# Patient Record
Sex: Male | Born: 1960
Health system: Southern US, Community
[De-identification: ages and names within clinical notes are randomized; demographics above are authoritative.]

## PROBLEM LIST (undated history)

## (undated) DIAGNOSIS — K589 Irritable bowel syndrome without diarrhea: Secondary | ICD-10-CM

## (undated) DIAGNOSIS — I10 Essential (primary) hypertension: Secondary | ICD-10-CM

## (undated) DIAGNOSIS — S43006A Unspecified dislocation of unspecified shoulder joint, initial encounter: Secondary | ICD-10-CM

## (undated) HISTORY — DX: Essential (primary) hypertension: I10

## (undated) HISTORY — DX: Irritable bowel syndrome without diarrhea: K58.9

## (undated) HISTORY — DX: Unspecified dislocation of unspecified shoulder joint, initial encounter: S43.006A

---

## 2001-04-04 ENCOUNTER — Encounter: Admission: RE | Admit: 2001-04-04 | Discharge: 2001-05-29 | Payer: Self-pay | Admitting: *Deleted

## 2004-11-30 HISTORY — PX: CHOLECYSTECTOMY: SHX55

## 2005-01-20 ENCOUNTER — Encounter: Admission: RE | Admit: 2005-01-20 | Discharge: 2005-01-20 | Payer: Self-pay | Admitting: Family Medicine

## 2005-01-26 ENCOUNTER — Observation Stay (HOSPITAL_COMMUNITY): Admission: RE | Admit: 2005-01-26 | Discharge: 2005-01-27 | Payer: Self-pay | Admitting: General Surgery

## 2005-09-18 IMAGING — US US ABDOMEN COMPLETE
1 series · 14 of 25 positions shown · non-contrast
Comparison: none

CLINICAL DATA: Elevated LFTs.  Epigastric pain with nausea and post prandial vomiting.
ULTRASOUND ABDOMEN, COMPLETE ? 01/20/05:
Some shadowing floating tiny gallstones and sludge are seen.   The gallbladder wall is thickened measuring 6 mm with no definite ultrasound Murphy sign with patient tender in epigastric region.  No dilated intrahepatic nor extrahepatic bile ducts are seen with the common bile duct measuring normally at 4 mm.  Liver, spleen, bilateral kidneys, and mid to distal abdominal aorta are sonographically normal.  Limited assessment if inferior vena cava, pancreas, and proximal abdominal aorta due to overlying gastrointestinal gas are noted.

[Series 1: unknown · 0.27mm/px · 14 of 58 slices shown]
[im 1/58]
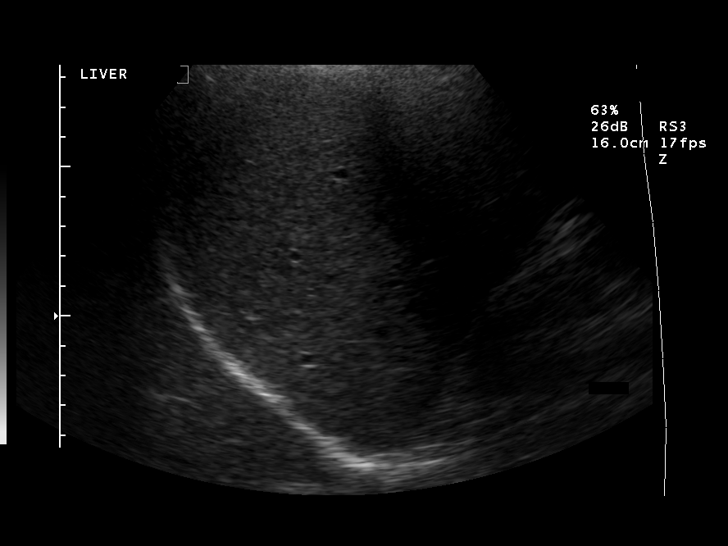
[im 5/58]
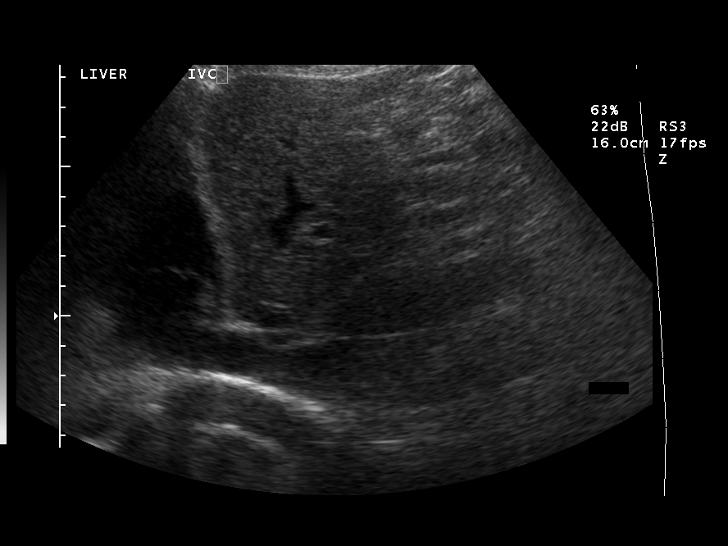
[im 10/58]
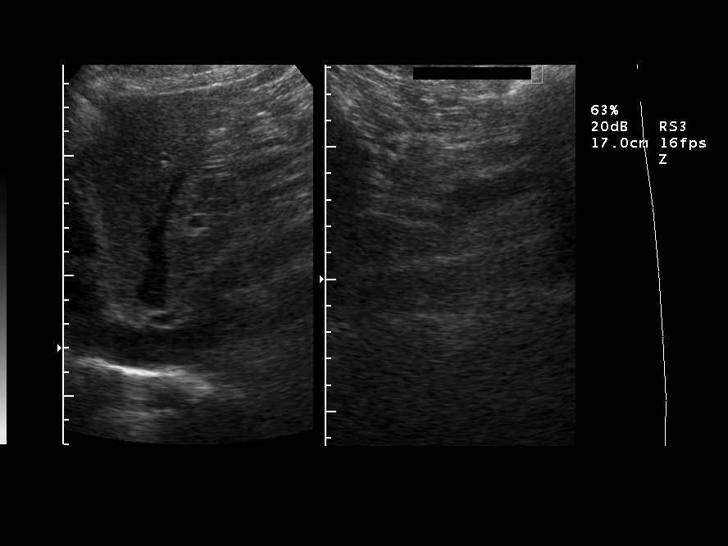
[im 15/58]
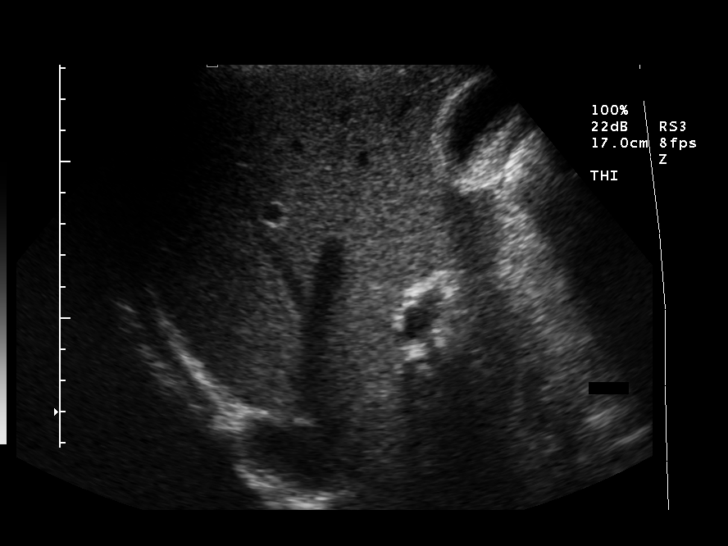
[im 20/58]
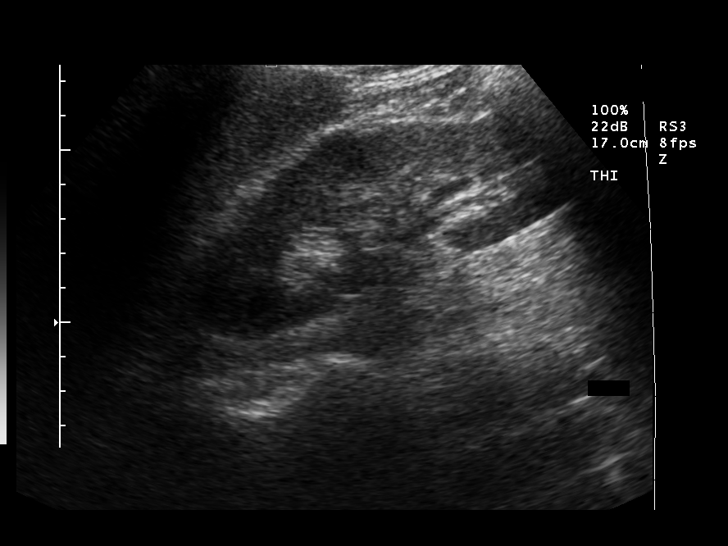
[im 22/58]
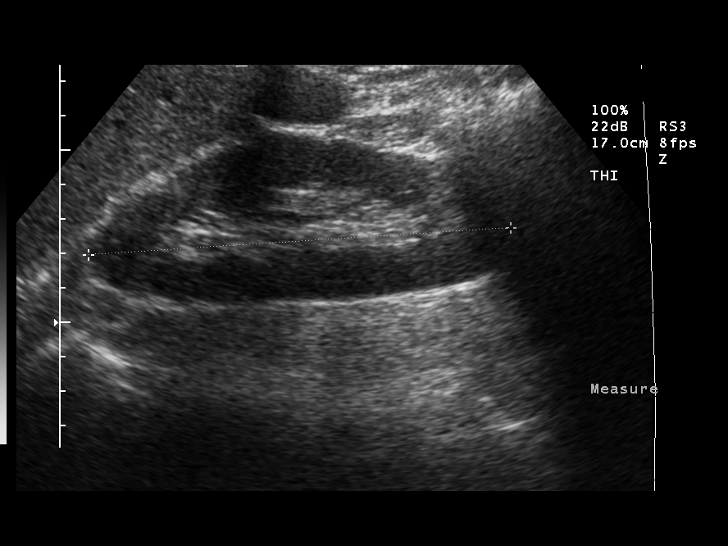
[im 27/58]
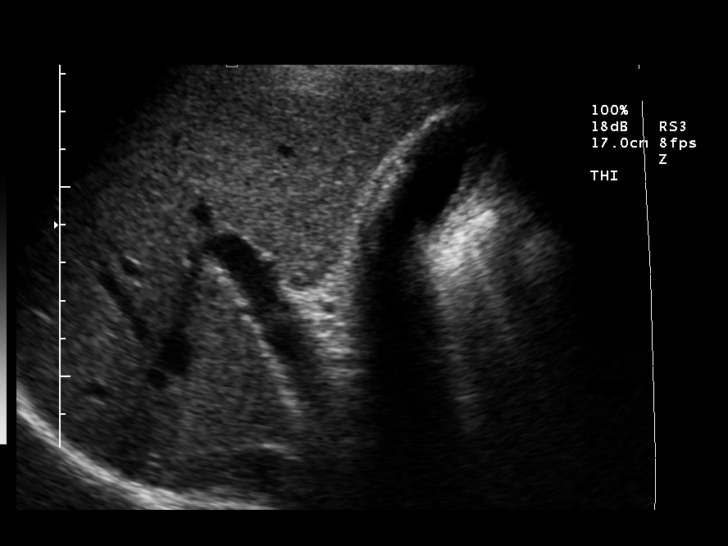
[im 31/58]
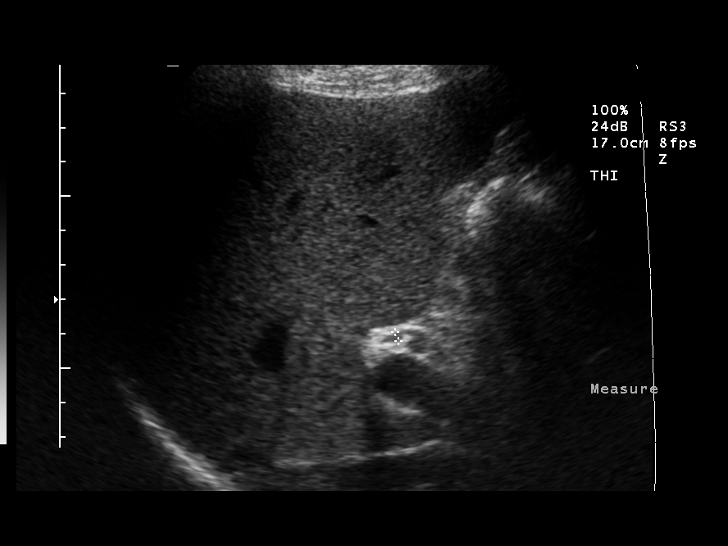
[im 36/58]
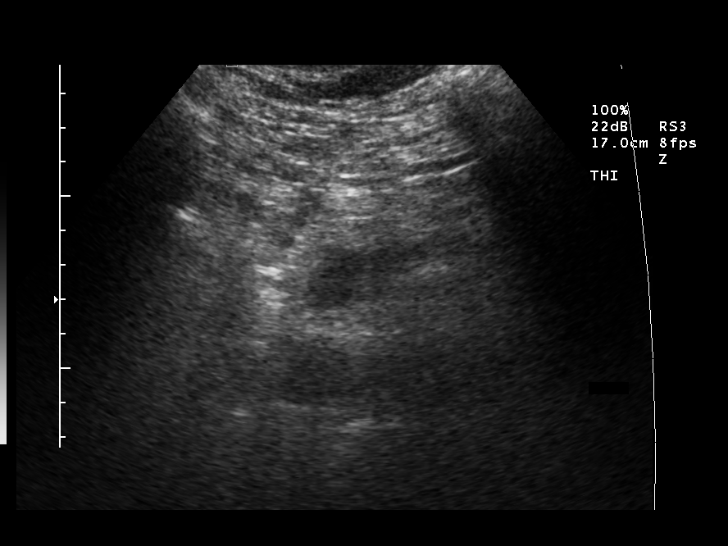
[im 39/58]
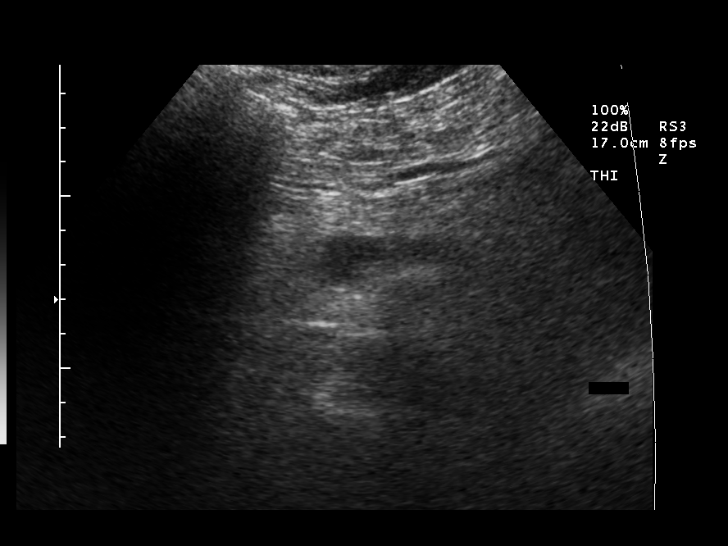
[im 43/58]
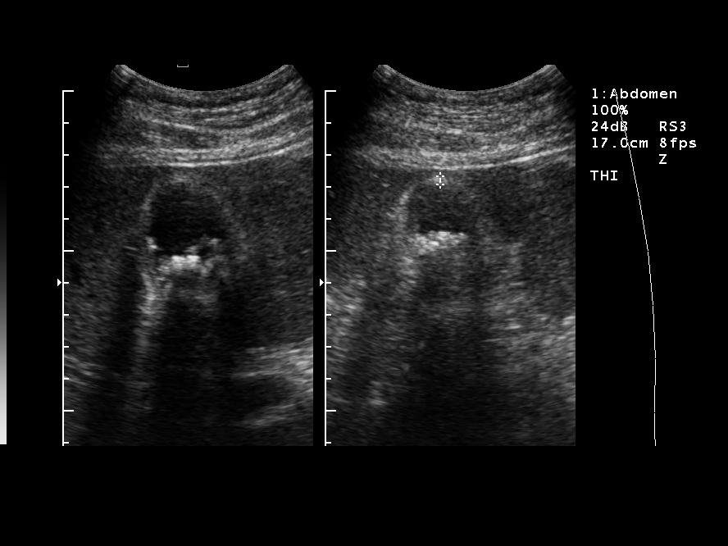
[im 48/58]
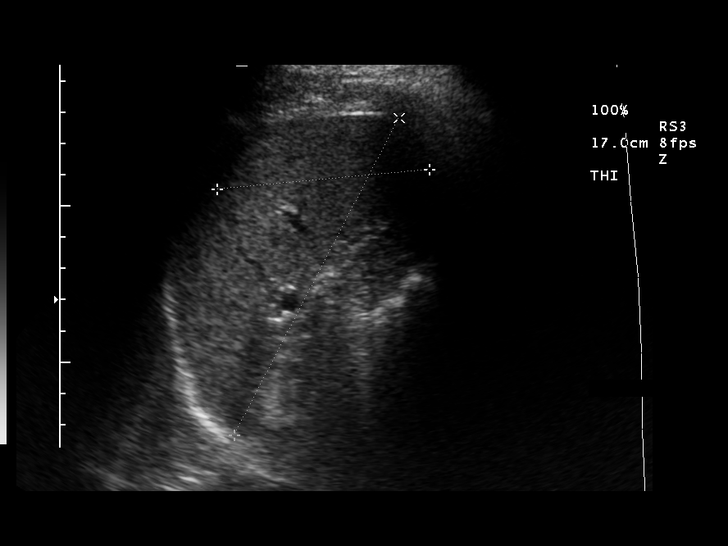
[im 53/58]
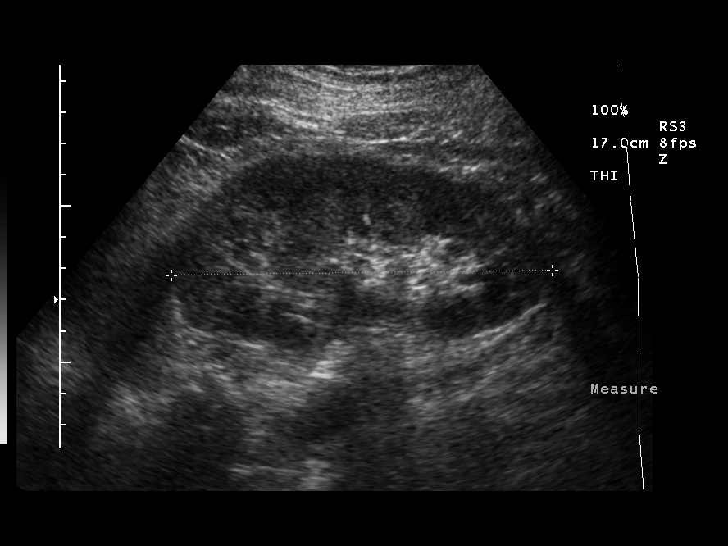
[im 58/58]
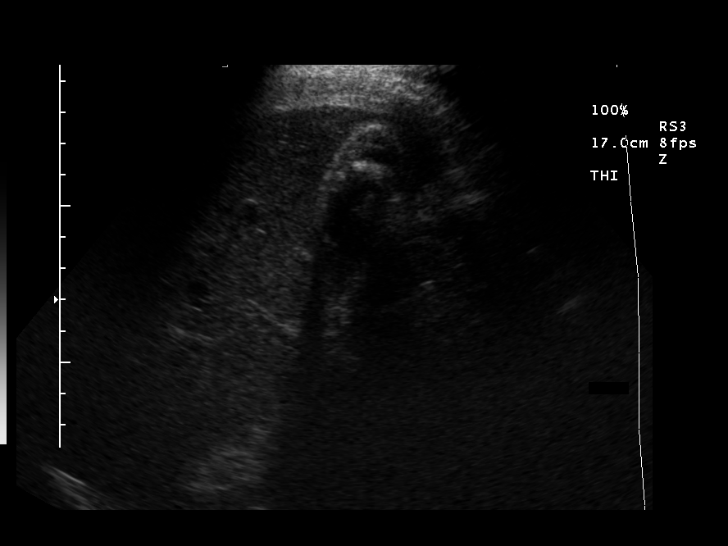

[14 of 25 positions shown; findings below may reference images not displayed]

IMPRESSION: 1.  Gallbladder sludge and tiny shadowing gallstones with abnormal gallbladder wall thickening of chronic or acute cholecystitis. 
2.  Slight limitations of inferior vena cava, pancreas, and proximal abdominal aorta.
3.  Otherwise normal.

## 2005-09-24 IMAGING — RF DG CHOLANGIOGRAM OPERATIVE
1 series · 24 of 24 positions shown · non-contrast
Comparison: none

CLINICAL DATA: Cholelithiasis

[Series 1: run · 24 of 41 slices shown]
[im 1/41]
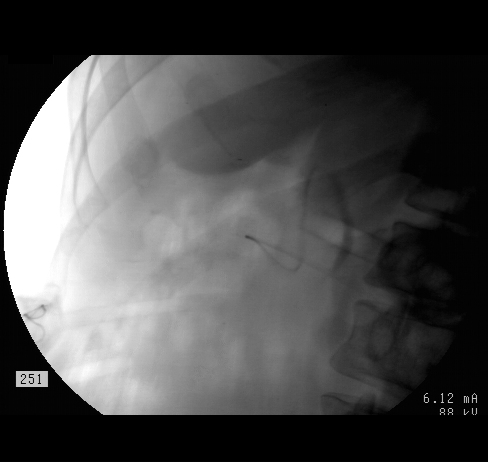
[im 2/41]
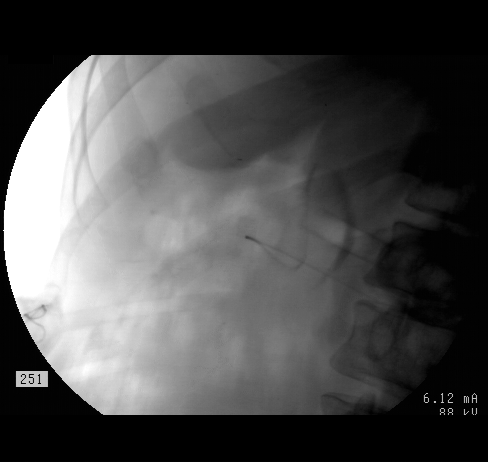
[im 4/41]
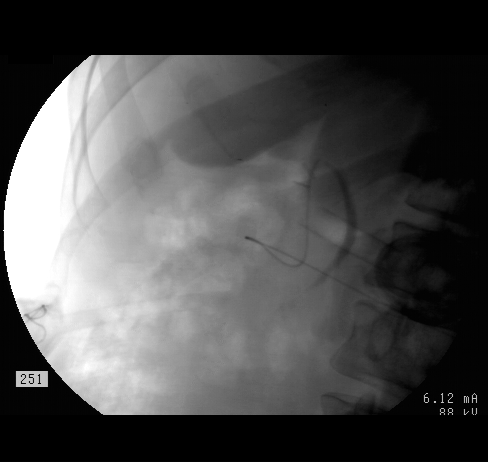
[im 6/41]
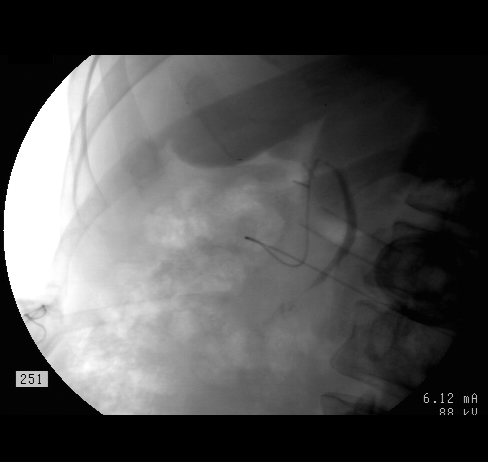
[im 7/41]
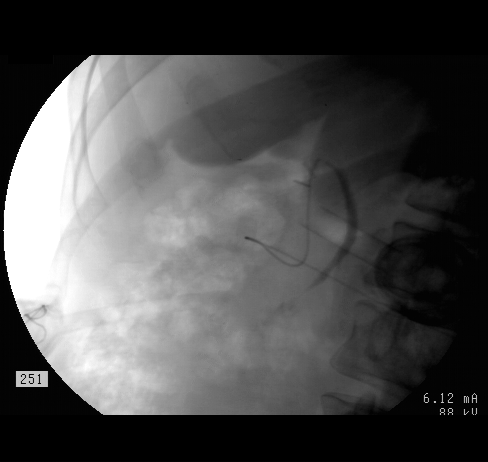
[im 9/41]
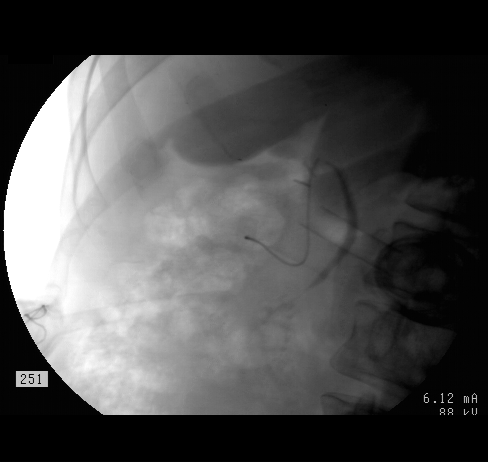
[im 11/41]
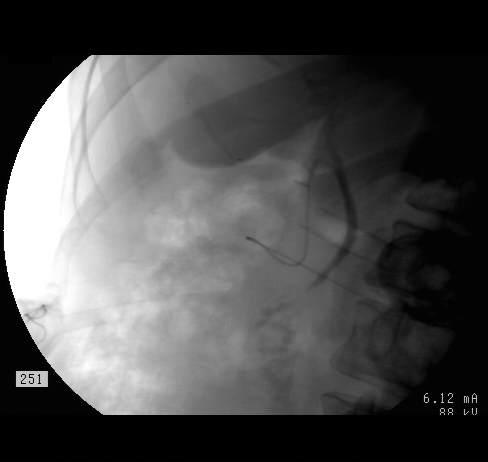
[im 13/41]
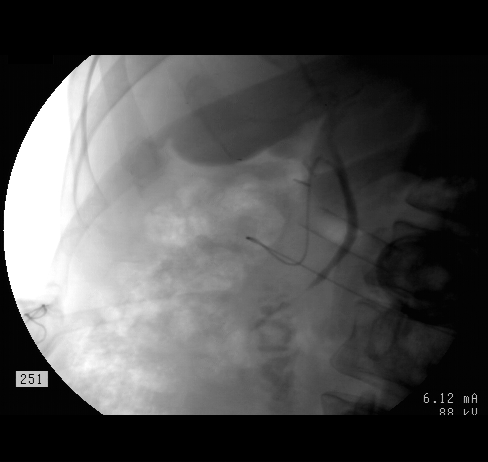
[im 14/41]
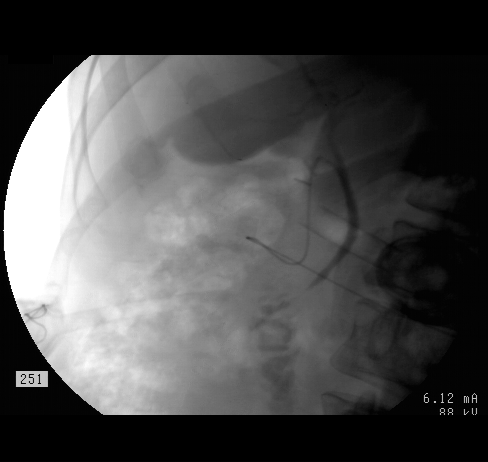
[im 16/41]
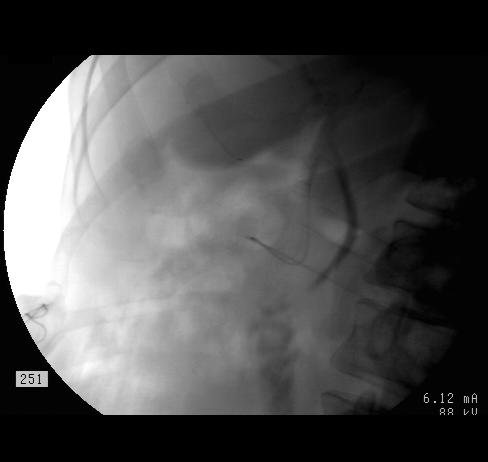
[im 18/41]
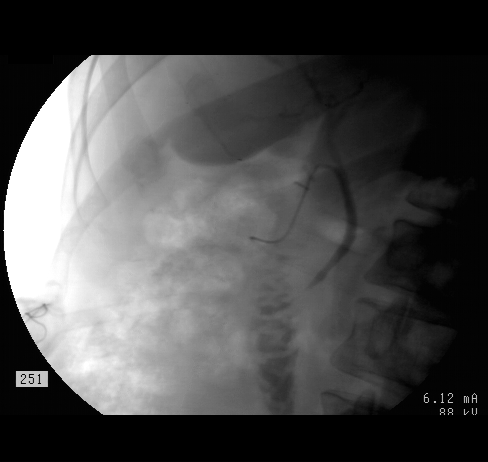
[im 20/41]
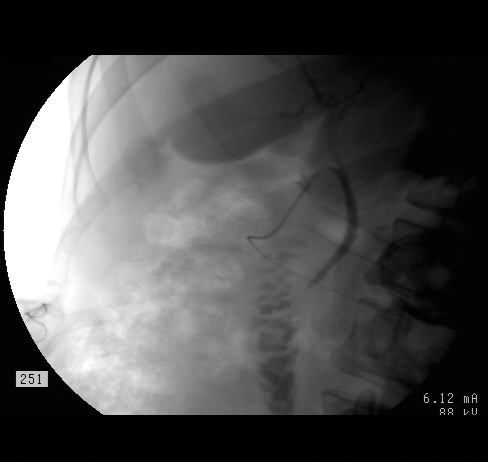
[im 21/41]
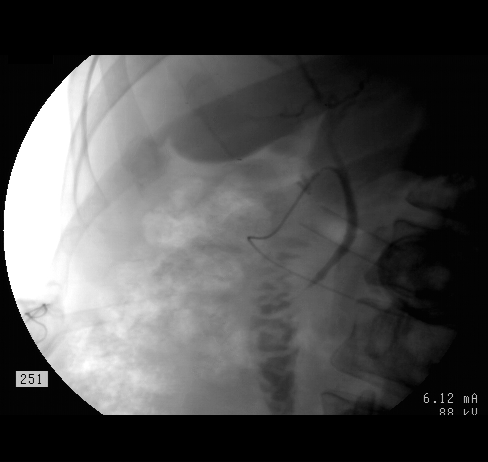
[im 23/41]
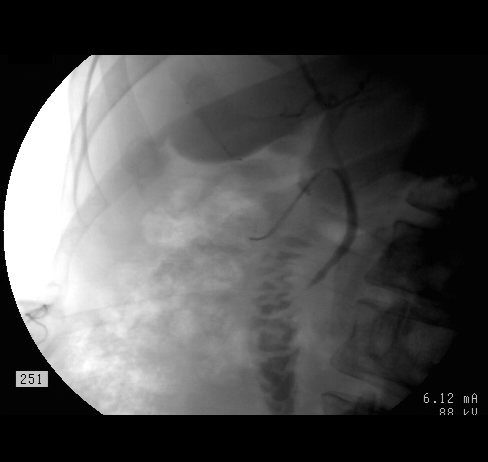
[im 25/41]
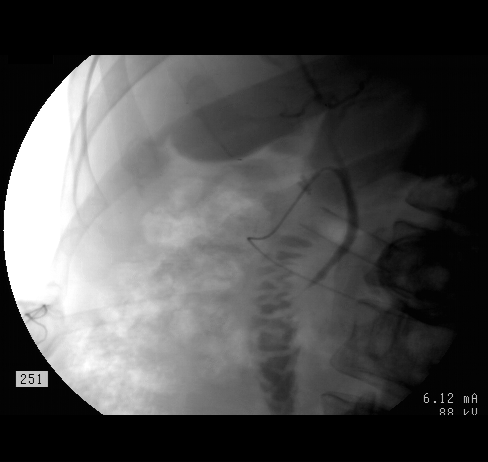
[im 27/41]
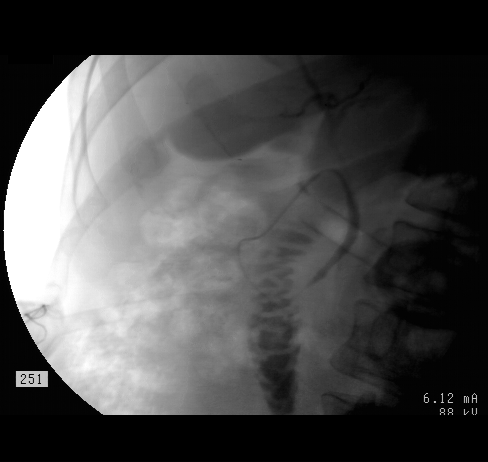
[im 28/41]
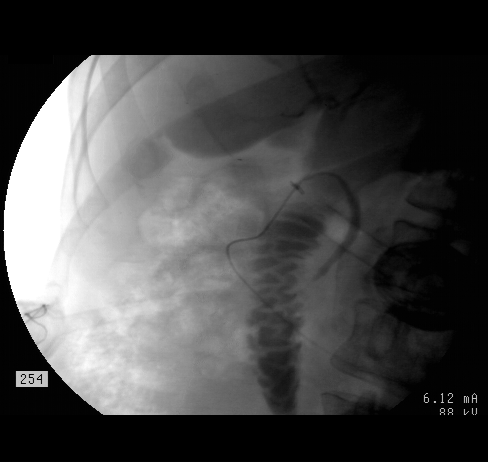
[im 30/41]
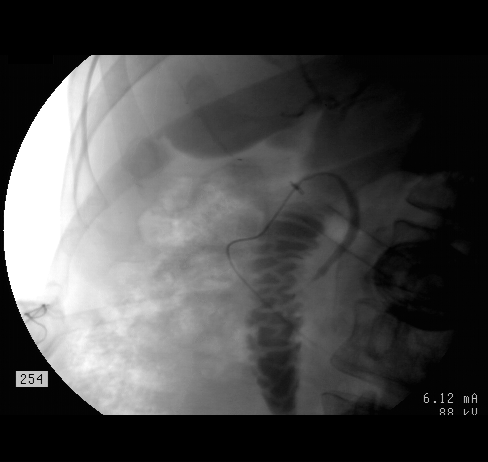
[im 32/41]
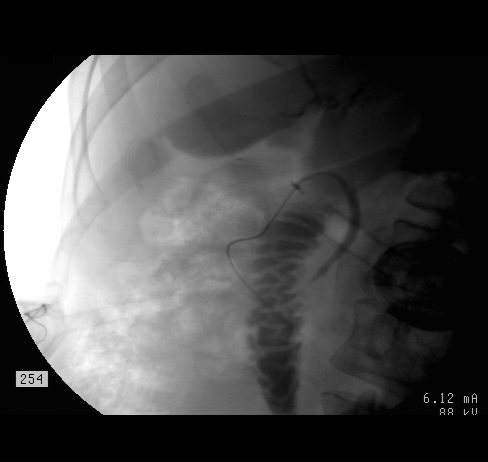
[im 34/41]
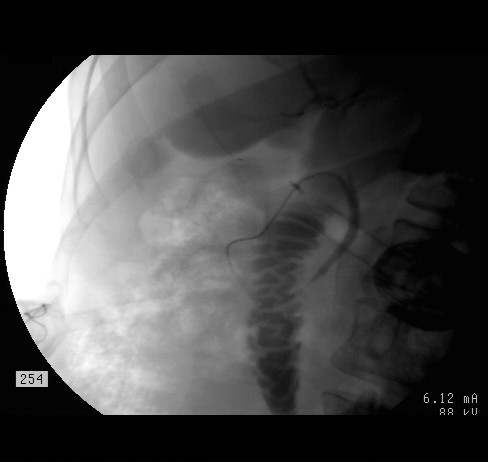
[im 35/41]
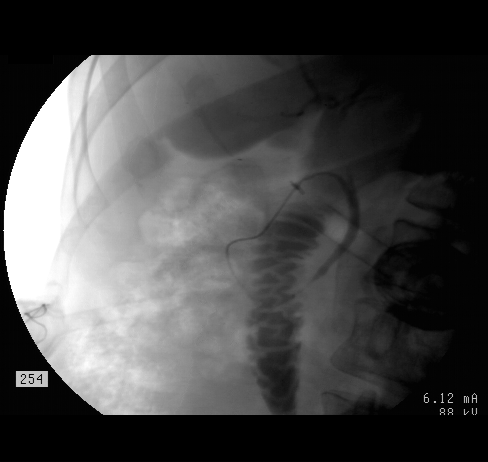
[im 37/41]
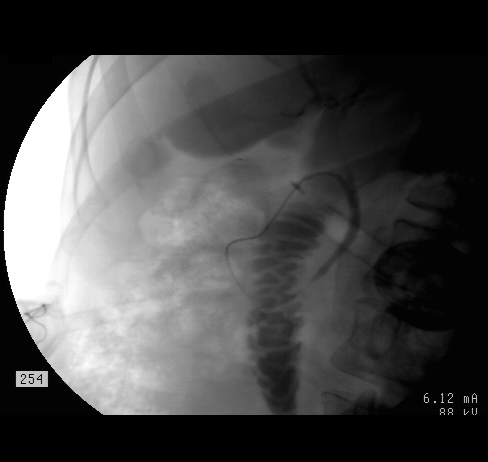
[im 39/41]
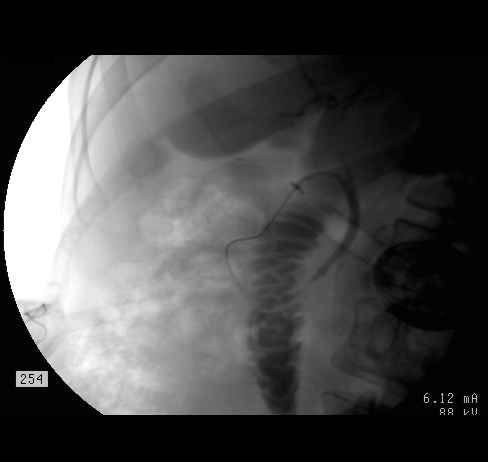
[im 41/41]
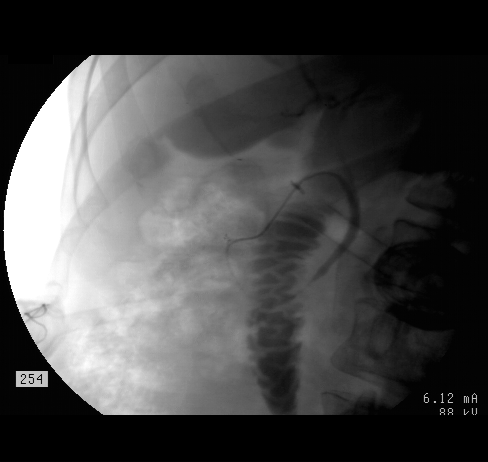

[24 of 24 positions shown; findings below may reference images not displayed]

INTRAOPERATIVE CHOLANGIOGRAM:

41 images  from intraoperative C-arm fluoroscopy demonstrates opacification of
the common bile duct. No filling defects to suggest retained stones. There is
incomplete evaluation of intrahepatic biliary tree, which appears decompressed
centrally. Contrast appears to flow on into decompressed duodenum.
IMPRESSION: 1. Negative for retained common duct stone

## 2011-05-24 ENCOUNTER — Encounter: Payer: Self-pay | Admitting: Family Medicine

## 2011-05-24 DIAGNOSIS — K589 Irritable bowel syndrome without diarrhea: Secondary | ICD-10-CM | POA: Insufficient documentation

## 2013-12-28 DIAGNOSIS — S43006A Unspecified dislocation of unspecified shoulder joint, initial encounter: Secondary | ICD-10-CM

## 2013-12-28 HISTORY — DX: Unspecified dislocation of unspecified shoulder joint, initial encounter: S43.006A

## 2014-05-08 ENCOUNTER — Encounter: Payer: Self-pay | Admitting: *Deleted

## 2014-06-08 ENCOUNTER — Ambulatory Visit (INDEPENDENT_AMBULATORY_CARE_PROVIDER_SITE_OTHER): Payer: 59 | Admitting: Family Medicine

## 2014-06-08 ENCOUNTER — Encounter: Payer: Self-pay | Admitting: Family Medicine

## 2014-06-08 VITALS — BP 138/100 | HR 84 | Temp 97.8°F | Resp 14 | Ht 70.0 in | Wt 204.0 lb

## 2014-06-08 DIAGNOSIS — Z0000 Encounter for general adult medical examination without abnormal findings: Secondary | ICD-10-CM

## 2014-06-08 DIAGNOSIS — Z23 Encounter for immunization: Secondary | ICD-10-CM

## 2014-06-08 DIAGNOSIS — I10 Essential (primary) hypertension: Secondary | ICD-10-CM | POA: Insufficient documentation

## 2014-06-08 DIAGNOSIS — Z125 Encounter for screening for malignant neoplasm of prostate: Secondary | ICD-10-CM

## 2014-06-08 DIAGNOSIS — H6120 Impacted cerumen, unspecified ear: Secondary | ICD-10-CM

## 2014-06-08 DIAGNOSIS — Z1211 Encounter for screening for malignant neoplasm of colon: Secondary | ICD-10-CM

## 2014-06-08 DIAGNOSIS — H6121 Impacted cerumen, right ear: Secondary | ICD-10-CM

## 2014-06-08 DIAGNOSIS — K589 Irritable bowel syndrome without diarrhea: Secondary | ICD-10-CM

## 2014-06-08 DIAGNOSIS — Z8249 Family history of ischemic heart disease and other diseases of the circulatory system: Secondary | ICD-10-CM

## 2014-06-08 DIAGNOSIS — R030 Elevated blood-pressure reading, without diagnosis of hypertension: Secondary | ICD-10-CM

## 2014-06-08 NOTE — Assessment & Plan Note (Addendum)
Consider Bentyl, will f/u labs first Colonoscopy needed

## 2014-06-08 NOTE — Addendum Note (Signed)
Addended by: Sheral Flow on: 06/08/2014 04:16 PM   Modules accepted: Orders

## 2014-06-08 NOTE — Assessment & Plan Note (Addendum)
Elevated BP in office as well, he states he has some anxiety at the doctors office, Will have him check BP at home once a day ,  F/U3 weeks if still elevated > 140/90  Start norvasc 5mg 

## 2014-06-08 NOTE — Progress Notes (Signed)
[name], male   DOB: 1961-01-23, 53 y.o.   MRN: [id number]   Subjective:    [name], male    DOB: 10/18/1961, 53 y.o.   MRN: [id number]  Patient presents for New Patient CPE  patient here to establish care. He's never had a primary care provider. He's not had a physical in greater than 20 years. 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 She has a history of self diagnosed irritable bowel syndrome he's had this since he was a young child. 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 He's been taking Imodium on and off as well as other over-the-counter medications to help. He's never had a colonoscopy.  He was recently at his DOT physical and his blood pressure was elevated he has checked a couple times at home and it has been fluctuating from 140-150 over 80s to 100. He does have a family history of heart disease his father had a heart attack at age 57. In the past he did have a screening done on his cholesterol was also show severely elevated cholesterol but he is not on medications because his father had a severe reaction to Lipitor. He has had a mild headache over the past couple of months he thought this was due to being alone dehydrated as he does not eat or drink very much while he is on a truck. He also had an episode of severe indigestion which has resolved. H/o stress test in 90's which was normal  He is due for tetanus booster due for colonoscopy due for prostate cancer screening Review Of Systems:  GEN- denies fatigue, fever, weight loss,weakness, recent illness HEENT- denies eye drainage, change in vision, nasal discharge, CVS- denies chest pain, palpitations RESP- denies SOB, cough, wheeze ABD- denies N/V, change in stools, abd pain GU-  denies dysuria, hematuria, dribbling, incontinence MSK- denies joint pain, muscle aches, injury Neuro- +headache, dizziness, syncope, seizure activity       Objective:    BP 140/88  Pulse 84  Temp(Src) 97.8 F (36.6 C) (Oral)  Resp 14  Ht 5\' 10"  (1.778 m)  Wt 204 lb (92.534 kg)  BMI 29.27 kg/m2 GEN- NAD, alert and oriented x3 HEENT- PERRL, EOMI, non injected sclera, pink conjunctiva, MMM, oropharynx clear, Right TM impacted wax, left TM clear Neck- Supple, no thyromegaly CVS- RRR, no murmur RESP-CTAB ABD-NABS,soft,NT,ND GU- Rectum- normal tone, external skin tag, FOBT neg, prostate smooth no nodules Psych- normal affect and mood EXT- No edema Pulses- Radial, DP- 2+  EKG- NSR, no ST changes       Assessment & Plan:      Problem List Items Addressed This Visit   Routine general medical examination at a health care facility - Primary     CPE done Tetanus Booster given Fasting labs Referral for colonoscopy    Relevant Orders      CBC with Differential      Comprehensive metabolic panel      Lipid panel      PSA   IBS (irritable bowel syndrome)     Trial of Bentyl with meals Colonoscopy needed    Elevated blood pressure (not hypertension)     Elevated BP in office as well, he states  he has some anxiety at the doctors office, Will have him check BP at home once a day ,  F/U 2 weeks if still elevated > 140/90  Start norvasc 5mg      Relevant Orders      TSH    Other Visit Diagnoses   Prostate cancer screening        Relevant Orders       PSA       Note: This dictation was prepared with Dragon dictation along with smaller phrase technology. Any transcriptional errors that result from this process are unintentional.

## 2014-06-08 NOTE — Assessment & Plan Note (Signed)
S/p irrigation

## 2014-06-08 NOTE — Assessment & Plan Note (Signed)
CPE done Tetanus Booster given Fasting labs Referral for colonoscopy

## 2014-06-08 NOTE — Patient Instructions (Addendum)
I recommend eye visit once a year I recommend dental visit every 6 months Goal is to  Exercise 30 minutes 5 days a week We will send a letter with lab results  Referral for colonoscopy  F/U 4 weeks  Bring in your blood results

## 2014-06-09 LAB — COMPREHENSIVE METABOLIC PANEL
ALBUMIN: 4.8 g/dL (ref 3.5–5.2)
ALT: 24 U/L (ref 0–53)
AST: 21 U/L (ref 0–37)
Alkaline Phosphatase: 72 U/L (ref 39–117)
BUN: 10 mg/dL (ref 6–23)
CO2: 28 mEq/L (ref 19–32)
Calcium: 9.8 mg/dL (ref 8.4–10.5)
Chloride: 104 mEq/L (ref 96–112)
Creat: 1.09 mg/dL (ref 0.50–1.35)
Glucose, Bld: 95 mg/dL (ref 70–99)
POTASSIUM: 4.6 meq/L (ref 3.5–5.3)
SODIUM: 142 meq/L (ref 135–145)
Total Bilirubin: 1.7 mg/dL — ABNORMAL HIGH (ref 0.2–1.2)
Total Protein: 7.2 g/dL (ref 6.0–8.3)

## 2014-06-09 LAB — TSH: TSH: 1.84 u[IU]/mL (ref 0.350–4.500)

## 2014-06-09 LAB — CBC WITH DIFFERENTIAL/PLATELET
BASOS ABS: 0.1 10*3/uL (ref 0.0–0.1)
BASOS PCT: 1 % (ref 0–1)
EOS ABS: 0.1 10*3/uL (ref 0.0–0.7)
EOS PCT: 2 % (ref 0–5)
HEMATOCRIT: 45 % (ref 39.0–52.0)
HEMOGLOBIN: 15.4 g/dL (ref 13.0–17.0)
LYMPHS PCT: 21 % (ref 12–46)
Lymphs Abs: 1.2 10*3/uL (ref 0.7–4.0)
MCH: 29.6 pg (ref 26.0–34.0)
MCHC: 34.2 g/dL (ref 30.0–36.0)
MCV: 86.4 fL (ref 78.0–100.0)
MONO ABS: 0.5 10*3/uL (ref 0.1–1.0)
MONOS PCT: 8 % (ref 3–12)
NEUTROS ABS: 4 10*3/uL (ref 1.7–7.7)
Neutrophils Relative %: 68 % (ref 43–77)
Platelets: 218 10*3/uL (ref 150–400)
RBC: 5.21 MIL/uL (ref 4.22–5.81)
RDW: 14.5 % (ref 11.5–15.5)
WBC: 5.9 10*3/uL (ref 4.0–10.5)

## 2014-06-09 LAB — LIPID PANEL
Cholesterol: 235 mg/dL — ABNORMAL HIGH (ref 0–200)
HDL: 38 mg/dL — ABNORMAL LOW (ref >39)
LDL CALC: 155 mg/dL — AB (ref 0–99)
Total CHOL/HDL Ratio: 6.2 Ratio
Triglycerides: 209 mg/dL — ABNORMAL HIGH (ref <150)
VLDL: 42 mg/dL — ABNORMAL HIGH (ref 0–40)

## 2014-06-09 LAB — PSA: PSA: 1.69 ng/mL (ref <=4.00)

## 2014-06-23 ENCOUNTER — Encounter: Payer: Self-pay | Admitting: Family Medicine

## 2014-07-20 ENCOUNTER — Ambulatory Visit (INDEPENDENT_AMBULATORY_CARE_PROVIDER_SITE_OTHER): Payer: 59 | Admitting: Family Medicine

## 2014-07-20 ENCOUNTER — Encounter: Payer: Self-pay | Admitting: Family Medicine

## 2014-07-20 VITALS — BP 148/92 | HR 68 | Temp 97.5°F | Resp 12 | Ht 70.0 in | Wt 201.0 lb

## 2014-07-20 DIAGNOSIS — R202 Paresthesia of skin: Secondary | ICD-10-CM

## 2014-07-20 DIAGNOSIS — R209 Unspecified disturbances of skin sensation: Secondary | ICD-10-CM

## 2014-07-20 DIAGNOSIS — I10 Essential (primary) hypertension: Secondary | ICD-10-CM

## 2014-07-20 DIAGNOSIS — M79609 Pain in unspecified limb: Secondary | ICD-10-CM

## 2014-07-20 DIAGNOSIS — M25519 Pain in unspecified shoulder: Secondary | ICD-10-CM

## 2014-07-20 DIAGNOSIS — M25512 Pain in left shoulder: Secondary | ICD-10-CM

## 2014-07-20 MED ORDER — AMLODIPINE BESYLATE 5 MG PO TABS: 5.0000 mg | ORAL_TABLET | Freq: Every day | ORAL | Status: DC

## 2014-07-20 NOTE — Patient Instructions (Addendum)
Norvasc 5mg  once a day  Call me in 2 weeks with blood pressure readings.  To Whom it May Concern:     Mr. Garno sustained a left shoulder injury on the job in March 2015. For the past 4 weeks he has increased pain in rotator cuff and paresthesia of the 3rd-5th digits. I would recommend MRI of shoulder.  Please contact me if you have any concerns.   [phone].    -- Dr. Vic Blackbird

## 2014-07-20 NOTE — Progress Notes (Signed)
[name], male   DOB: 1961/05/31, 53 y.o.   MRN: [id number]   Subjective:    [name], male    DOB: 1961/05/18, 53 y.o.   MRN: [id number]  Patient presents for 4 week F/U and Shoulder Dislocation  patient here to followup elevated blood pressure. He's taken his blood pressure at home is ranging from 137-149/85-104 he did have a headache when his blood pressure was elevated a couple weeks ago but none since then no chest pain no shortness of breath. He also complains of recurrent left shoulder pain with tingling and numbness into his third through fifth digits. He sustained an injury to his shoulder when he was stepping over some tires back in March of 2015 he reached up to try staying himself on the job which resulted in dislocation of his left shoulder and some paresthesias at that time. It did improve that he was seen by workers, now the symptoms have returned over the past 4 weeks.    Review Of Systems:  GEN- denies fatigue, fever, weight loss,weakness, recent illness HEENT- denies eye drainage, change in vision, nasal discharge, CVS- denies chest pain, palpitations RESP- denies SOB, cough, wheeze ABD- denies N/V, change in stools, abd pain GU- denies dysuria, hematuria, dribbling, incontinence MSK- + joint pain, muscle aches, injury Neuro- denies headache, dizziness, syncope, seizure activity       Objective:    BP 148/92  Pulse 68  Temp(Src) 97.5 F (36.4 C) (Oral)  Resp 12  Ht 5\' 10"  (1.778 m)  Wt 201 lb (91.173 kg)  BMI 28.84 kg/m2 GEN- NAD, alert and oriented x3 Neck- Supple, good ROM MSK- TTP lateral shoulder and post shoulder, + empty can, pain with rotation  Neuro- normal tone, sensation in tact, normal monofilament, neg tinels, strength equal bilat UE, Pulses- Radial 2+        Assessment & Plan:      Problem List Items Addressed This Visit   Paresthesia and pain of left extremity     possible injury with brachial plexus  when his shoulder was dislocated or some other nerve impingment    Pain in joint, shoulder region     Based on injury and recurrent symptoms would benefit from MRI of shoulder    Essential hypertension, benign - Primary   Relevant Medications      amLODIpine (NORVASC) tablet      Note: This dictation was prepared with Dragon dictation along with smaller phrase technology. Any transcriptional errors that result from this process are unintentional.

## 2014-07-21 NOTE — Assessment & Plan Note (Addendum)
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

## 2014-07-21 NOTE — Assessment & Plan Note (Signed)
possible injury with brachial plexus when his shoulder was dislocated or some other nerve impingment

## 2014-08-11 ENCOUNTER — Encounter: Payer: Self-pay | Admitting: Family Medicine

## 2014-08-14 ENCOUNTER — Other Ambulatory Visit: Payer: Self-pay

## 2014-12-25 ENCOUNTER — Other Ambulatory Visit: Payer: Self-pay | Admitting: Family Medicine

## 2014-12-25 NOTE — Telephone Encounter (Signed)
Medication filled x1 with no refills.   Requires office visit before any further refills can be given.   Letter sent.  

## 2015-01-18 ENCOUNTER — Encounter: Payer: Self-pay | Admitting: Family Medicine

## 2015-01-18 ENCOUNTER — Ambulatory Visit (INDEPENDENT_AMBULATORY_CARE_PROVIDER_SITE_OTHER): Payer: 59 | Admitting: Family Medicine

## 2015-01-18 VITALS — BP 150/86 | HR 71 | Temp 98.0°F | Resp 16 | Ht 70.0 in | Wt 198.0 lb

## 2015-01-18 DIAGNOSIS — R0789 Other chest pain: Secondary | ICD-10-CM | POA: Diagnosis not present

## 2015-01-18 DIAGNOSIS — I10 Essential (primary) hypertension: Secondary | ICD-10-CM | POA: Diagnosis not present

## 2015-01-18 DIAGNOSIS — E785 Hyperlipidemia, unspecified: Secondary | ICD-10-CM

## 2015-01-18 NOTE — Assessment & Plan Note (Addendum)
Blood pressure uncontrolled D/C NORVASC Given samples of benicar 40mg , he is take 1/2 tablet once a day , f/u 2 weeks for fasting labs and BP recheck

## 2015-01-18 NOTE — Assessment & Plan Note (Addendum)
He did not tolerate Crestor, has family history of hyperlipidemia, many unable to tolerate cholesterol medication. Has given him samples of  Zetia to try. He is also working on changing his diet. He will return to have fasting labs done

## 2015-01-18 NOTE — Patient Instructions (Addendum)
Return for fasting labs Try the zetia for cholesterol Stop the norvasc Try Benicar 20mg  once a day  F/U 2 weeks for blood pressure recheck

## 2015-01-18 NOTE — Progress Notes (Signed)
[name], male   DOB: 10/18/1961, 54 y.o.   MRN: [id number]   Subjective:    [name], male    DOB: 1961-02-03, 54 y.o.   MRN: [id number]  Patient presents for F/U HTN and Medication Review/ Refills  Patient year to follow blood pressure. He states since he started the Norvasc which is back in September 2015 he has had twinges of chest pain on the left side it is nonradiating no shortness of breath or diaphoresis associated. He then switched up to taking his medicine at night and he was still gets symptoms. He has not had any leg swelling. He states his blood pressure is still been running high as well as typically been in the 150s over 90s. 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 His sleep has been good until recently when the time changed  Hyperlipidemia- he tried crestor caused cramps in hands  Review Of Systems:  GEN- denies fatigue, fever, weight loss,weakness, recent illness HEENT- denies eye drainage, change in vision, nasal discharge, CVS- +chest pain, Denies palpitations RESP- denies SOB, cough, wheeze ABD- denies N/V, change in stools, abd pain GU- denies dysuria, hematuria, dribbling, incontinence MSK- denies joint pain, muscle aches, injury Neuro- denies headache, dizziness, syncope, seizure activity       Objective:    BP 150/86 mmHg  Pulse 71  Temp(Src) 98 F (36.7 C) (Oral)  Resp 16  Ht 5\' 10"  (1.778 m)  Wt 198 lb (89.812 kg)  BMI 28.41 kg/m2 GEN- NAD, alert and oriented x3 CVS- RRR, no murmur RESP-CTAB Psych- Normal affect and mood EXT- No edema Pulses- Radial,2+  EKG- NSR, HR 56, no ST changes       Assessment & Plan:      Problem List  Items Addressed This Visit      Unprioritized   Hyperlipidemia    He did not tolerate Crestor, has family history of hyperlipidemia, many unable to tolerate cholesterol medication. Has given him samples of setting and to try. He is also working on changing his diet. He will return to have fasting labs done      Essential hypertension, benign    Blood pressure uncontrolled       Other Visit Diagnoses    Atypical chest pain    -  Primary    Chest pain atypical, not typical SE of norvasc either, but will d/c norvasc as this is when symptoms started, does not appear to be overly anxious    Relevant Orders    EKG 12-Lead (Completed)       Note: This dictation was prepared with Dragon dictation along with smaller phrase technology. Any transcriptional errors that result from this process are unintentional.

## 2015-02-01 ENCOUNTER — Encounter: Payer: Self-pay | Admitting: Family Medicine

## 2015-02-01 ENCOUNTER — Ambulatory Visit (INDEPENDENT_AMBULATORY_CARE_PROVIDER_SITE_OTHER): Payer: 59 | Admitting: Family Medicine

## 2015-02-01 VITALS — BP 130/74 | HR 68 | Temp 98.0°F | Resp 14 | Ht 70.0 in | Wt 199.0 lb

## 2015-02-01 DIAGNOSIS — E785 Hyperlipidemia, unspecified: Secondary | ICD-10-CM | POA: Diagnosis not present

## 2015-02-01 DIAGNOSIS — I10 Essential (primary) hypertension: Secondary | ICD-10-CM | POA: Diagnosis not present

## 2015-02-01 LAB — CBC WITH DIFFERENTIAL/PLATELET
Basophils Absolute: 0.1 10*3/uL (ref 0.0–0.1)
Basophils Relative: 1 % (ref 0–1)
Eosinophils Absolute: 0.1 10*3/uL (ref 0.0–0.7)
Eosinophils Relative: 2 % (ref 0–5)
HCT: 47.7 % (ref 39.0–52.0)
HEMOGLOBIN: 15.7 g/dL (ref 13.0–17.0)
LYMPHS ABS: 1.1 10*3/uL (ref 0.7–4.0)
LYMPHS PCT: 21 % (ref 12–46)
MCH: 29.3 pg (ref 26.0–34.0)
MCHC: 32.9 g/dL (ref 30.0–36.0)
MCV: 89.2 fL (ref 78.0–100.0)
MPV: 10.8 fL (ref 8.6–12.4)
Monocytes Absolute: 0.4 10*3/uL (ref 0.1–1.0)
Monocytes Relative: 7 % (ref 3–12)
NEUTROS PCT: 69 % (ref 43–77)
Neutro Abs: 3.6 10*3/uL (ref 1.7–7.7)
Platelets: 214 10*3/uL (ref 150–400)
RBC: 5.35 MIL/uL (ref 4.22–5.81)
RDW: 14.1 % (ref 11.5–15.5)
WBC: 5.2 10*3/uL (ref 4.0–10.5)

## 2015-02-01 LAB — COMPREHENSIVE METABOLIC PANEL
ALBUMIN: 4.4 g/dL (ref 3.5–5.2)
ALT: 22 U/L (ref 0–53)
AST: 16 U/L (ref 0–37)
Alkaline Phosphatase: 81 U/L (ref 39–117)
BILIRUBIN TOTAL: 1.2 mg/dL (ref 0.2–1.2)
BUN: 13 mg/dL (ref 6–23)
CALCIUM: 9.4 mg/dL (ref 8.4–10.5)
CHLORIDE: 107 meq/L (ref 96–112)
CO2: 29 mEq/L (ref 19–32)
CREATININE: 0.97 mg/dL (ref 0.50–1.35)
Glucose, Bld: 98 mg/dL (ref 70–99)
Potassium: 4.7 mEq/L (ref 3.5–5.3)
SODIUM: 142 meq/L (ref 135–145)
TOTAL PROTEIN: 6.9 g/dL (ref 6.0–8.3)

## 2015-02-01 LAB — LIPID PANEL
CHOL/HDL RATIO: 5.8 ratio
CHOLESTEROL: 203 mg/dL — AB (ref 0–200)
HDL: 35 mg/dL — ABNORMAL LOW (ref >=40)
LDL Cholesterol: 132 mg/dL — ABNORMAL HIGH (ref 0–99)
TRIGLYCERIDES: 180 mg/dL — AB (ref <150)
VLDL: 36 mg/dL (ref 0–40)

## 2015-02-01 MED ORDER — EZETIMIBE 10 MG PO TABS: 10.0000 mg | ORAL_TABLET | Freq: Every day | ORAL | Status: DC

## 2015-02-01 MED ORDER — OLMESARTAN MEDOXOMIL 20 MG PO TABS: 20.0000 mg | ORAL_TABLET | Freq: Every day | ORAL | Status: DC

## 2015-02-01 NOTE — Progress Notes (Signed)
[name], male   DOB: 21-Feb-1961, 54 y.o.   MRN: [id number]   Subjective:    [name], male    DOB: April 22, 1961, 54 y.o.   MRN: [id number]  Patient presents for F/U  Patient here for interim follow-up visit on his hypertension. Her last visit we discontinued the amlodipine as this was causing some chest discomfort. He is tolerated the Benicar without any problems. His blood pressure at home has been from 291-916 systolic. He was also started on Saturday gave him samples of this for his cholesterol he has not had any muscle cramps or aches.   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 He was asking more for his wife but I am noted this in case he has any problems. His home has large amount of the laminate   Review Of Systems:  GEN- denies fatigue, fever, weight loss,weakness, recent illness HEENT- denies eye drainage, change in vision, nasal discharge, CVS- denies chest pain, palpitations RESP- denies SOB, cough, wheeze ABD- denies N/V, change in stools, abd pain GU- denies dysuria, hematuria, dribbling, incontinence MSK- denies joint pain, muscle aches, injury Neuro- denies headache, dizziness, syncope, seizure activity       Objective:    BP 130/74 mmHg  Pulse 68  Temp(Src) 98 F (36.7 C) (Oral)  Resp 14  Ht 5\' 10"  (1.778 m)  Wt 199 lb (90.266 kg)  BMI 28.55 kg/m2 GEN- NAD, alert and oriented x3 CVS- RRR, no murmur RESP-CTAB EXT- No edema Pulses- Radial 2+        Assessment & Plan:      Problem List Items Addressed This Visit      Unprioritized   Hyperlipidemia   Relevant Orders   Lipid panel   Essential hypertension, benign - Primary   Relevant Orders   CBC with Differential/Platelet   Comprehensive metabolic panel      Note: This dictation was prepared with Dragon dictation along with smaller phrase technology. Any transcriptional errors that  result from this process are unintentional.

## 2015-02-01 NOTE — Patient Instructions (Signed)
Continue the zetia for cholesterol Continue benicar 20mg   F/U 6 months

## 2015-02-01 NOTE — Assessment & Plan Note (Signed)
Continue with zetia, no side effects Fasting labs today

## 2015-02-01 NOTE — Assessment & Plan Note (Signed)
Blood pressure well controlled

## 2015-02-02 ENCOUNTER — Other Ambulatory Visit: Payer: Self-pay | Admitting: *Deleted

## 2015-02-02 ENCOUNTER — Ambulatory Visit: Payer: 59 | Admitting: Family Medicine

## 2015-02-02 DIAGNOSIS — E785 Hyperlipidemia, unspecified: Secondary | ICD-10-CM

## 2015-02-18 ENCOUNTER — Telehealth: Payer: Self-pay | Admitting: Family Medicine

## 2015-02-18 NOTE — Telephone Encounter (Signed)
Patient wife calling to say that he only has one more of the benicar samples and would need an rx called in for this if possible to the [facility] on [address]  [phone] if any questions

## 2015-02-19 MED ORDER — OLMESARTAN MEDOXOMIL 20 MG PO TABS: 20.0000 mg | ORAL_TABLET | Freq: Every day | ORAL | Status: DC

## 2015-02-19 NOTE — Telephone Encounter (Signed)
Refill appropriate and filled per protocol. 

## 2015-08-06 ENCOUNTER — Ambulatory Visit (INDEPENDENT_AMBULATORY_CARE_PROVIDER_SITE_OTHER): Payer: 59 | Admitting: Family Medicine

## 2015-08-06 ENCOUNTER — Encounter: Payer: Self-pay | Admitting: Family Medicine

## 2015-08-06 VITALS — BP 130/74 | HR 82 | Temp 98.4°F | Resp 14 | Ht 70.0 in | Wt 195.0 lb

## 2015-08-06 DIAGNOSIS — I10 Essential (primary) hypertension: Secondary | ICD-10-CM | POA: Diagnosis not present

## 2015-08-06 DIAGNOSIS — E785 Hyperlipidemia, unspecified: Secondary | ICD-10-CM | POA: Diagnosis not present

## 2015-08-06 NOTE — Patient Instructions (Signed)
Continue current medications Fish oil 1 capsule twice a day  We will call with lab results  F/U 6 months

## 2015-08-06 NOTE — Assessment & Plan Note (Signed)
Well controlled, no change to meds Check fasting labs and cholesterol

## 2015-08-06 NOTE — Progress Notes (Signed)
[name], male   DOB: Mar 03, 1961, 54 y.o.   MRN: [id number]   Subjective:    [name], male    DOB: 14-Aug-1961, 54 y.o.   MRN: [id number]  Patient presents for 6 month F/U  follow-up medications. He had to stop his that area because of cost he was also coughing some muscle aches. He has not tolerated statins in the past. He is taking his blood pressure medicine as prescribed and has no concerns today. Declined flu shot     Review Of Systems:  GEN- denies fatigue, fever, weight loss,weakness, recent illness HEENT- denies eye drainage, change in vision, nasal discharge, CVS- denies chest pain, palpitations RESP- denies SOB, cough, wheeze ABD- denies N/V, change in stools, abd pain GU- denies dysuria, hematuria, dribbling, incontinence MSK- denies joint pain, muscle aches, injury Neuro- denies headache, dizziness, syncope, seizure activity       Objective:    BP 130/74 mmHg  Pulse 82  Temp(Src) 98.4 F (36.9 C) (Oral)  Resp 14  Ht 5\' 10"  (1.778 m)  Wt 195 lb (88.451 kg)  BMI 27.98 kg/m2 GEN- NAD, alert and oriented x3 HEENT- PERRL, EOMI, non injected sclera, pink conjunctiva, MMM, oropharynx clear CVS- RRR, no murmur RESP-CTAB EXT- No edema Pulses- Radial, 2+        Assessment & Plan:      Problem List Items Addressed This Visit    Hyperlipidemia - Primary    Does not tolerate statins, Zetia, start fish oil, monitor diet, no CAD, so will have to monitor with diet for now      Relevant Orders   Lipid panel   Essential hypertension, benign    Well controlled, no change to meds Check fasting labs and cholesterol      Relevant Orders   Comprehensive metabolic panel      Note: This dictation was prepared with Dragon dictation along with smaller phrase technology. Any transcriptional errors that result from this process are unintentional.

## 2015-08-06 NOTE — Assessment & Plan Note (Signed)
Does not tolerate statins, Zetia, start fish oil, monitor diet, no CAD, so will have to monitor with diet for now

## 2015-08-07 LAB — LIPID PANEL
CHOL/HDL RATIO: 6.1 ratio — AB (ref <=5.0)
CHOLESTEROL: 242 mg/dL — AB (ref 125–200)
HDL: 40 mg/dL (ref >=40)
LDL Cholesterol: 177 mg/dL — ABNORMAL HIGH (ref <130)
TRIGLYCERIDES: 125 mg/dL (ref <150)
VLDL: 25 mg/dL (ref <30)

## 2015-08-07 LAB — COMPREHENSIVE METABOLIC PANEL
ALBUMIN: 4.4 g/dL (ref 3.6–5.1)
ALK PHOS: 69 U/L (ref 40–115)
ALT: 19 U/L (ref 9–46)
AST: 19 U/L (ref 10–35)
BILIRUBIN TOTAL: 1.6 mg/dL — AB (ref 0.2–1.2)
BUN: 18 mg/dL (ref 7–25)
CALCIUM: 9.7 mg/dL (ref 8.6–10.3)
CO2: 27 mmol/L (ref 20–31)
Chloride: 101 mmol/L (ref 98–110)
Creat: 0.98 mg/dL (ref 0.70–1.33)
Glucose, Bld: 87 mg/dL (ref 70–99)
Potassium: 3.8 mmol/L (ref 3.5–5.3)
Sodium: 138 mmol/L (ref 135–146)
Total Protein: 7 g/dL (ref 6.1–8.1)

## 2016-02-07 ENCOUNTER — Ambulatory Visit (INDEPENDENT_AMBULATORY_CARE_PROVIDER_SITE_OTHER): Payer: 59 | Admitting: Family Medicine

## 2016-02-07 ENCOUNTER — Encounter: Payer: Self-pay | Admitting: Family Medicine

## 2016-02-07 VITALS — BP 130/82 | HR 78 | Temp 98.7°F | Resp 14 | Ht 70.0 in | Wt 202.0 lb

## 2016-02-07 DIAGNOSIS — E785 Hyperlipidemia, unspecified: Secondary | ICD-10-CM

## 2016-02-07 DIAGNOSIS — I10 Essential (primary) hypertension: Secondary | ICD-10-CM

## 2016-02-07 DIAGNOSIS — Z6379 Other stressful life events affecting family and household: Secondary | ICD-10-CM | POA: Diagnosis not present

## 2016-02-07 MED ORDER — OLMESARTAN MEDOXOMIL 20 MG PO TABS: 20.0000 mg | ORAL_TABLET | Freq: Every day | ORAL | Status: DC

## 2016-02-07 NOTE — Patient Instructions (Signed)
Continue current meds We will call with lab results F/U Sept for Physical

## 2016-02-07 NOTE — Assessment & Plan Note (Signed)
Blood pressure well controlled and change medication 

## 2016-02-07 NOTE — Assessment & Plan Note (Signed)
Recheck cholesterol labs diet hopefully we can get his LDL below 140

## 2016-02-07 NOTE — Progress Notes (Signed)
[name], male   DOB: [birth year], 55 y.o.   MRN: [id number]    Subjective:    [name], male    DOB: June 19, 1961, 55 y.o.   MRN: [id number]  Patient presents for 6 month F/U History here for six-month follow-up on hypertension and hyperlipidemia. He is taking his Benicar as described. He is intolerant of cholesterol medications therefore we tried to treat this with diet and exercise. He is due for repeat fasting labs. He has had some stress the past few months and occasionally does help some chest discomfort but this is typical either when he is stressed or if he is doing some heavy lifting he will get some soreness in his chest. He denies any radiating chest pain denies any shortness of breath no diaphoresis no persistent pattern when this occurs randomly. The last time was a few months ago. His mother passed away in 2022-11-08 and he is the only child he's been helping his father who lives about 2 hours away. He states his father is having a lot of difficulty with depression and is stressing him out.   Review Of Systems:  GEN- denies fatigue, fever, weight loss,weakness, recent illness HEENT- denies eye drainage, change in vision, nasal discharge, CVS- denies chest pain, palpitations RESP- denies SOB, cough, wheeze ABD- denies N/V, change in stools, abd pain GU- denies dysuria, hematuria, dribbling, incontinence MSK- denies joint pain, muscle aches, injury Neuro- denies headache, dizziness, syncope, seizure activity       Objective:    BP 130/82 mmHg  Pulse 78  Temp(Src) 98.7 F (37.1 C) (Oral)  Resp 14  Ht 5\' 10"  (1.778 m)  Wt 202 lb (91.627 kg)  BMI 28.98 kg/m2 GEN- NAD, alert and oriented x3 HEENT- PERRL, EOMI, non injected sclera, pink conjunctiva, MMM, oropharynx clear, chewing snuff CVS- RRR, no murmur RESP-CTAB Psych- normal affect and mood EXT- No edema Pulses- Radial  2+        Assessment & Plan:      Problem List Items  Addressed This Visit    Stressful life event affecting family    Stressful family situation. With his mother passing away and now helping to care for his father. He states he will call if he feels he needs anything for his nerves at this time he wishes, wait things out. He does have some support of other friends where his father lives.      Hyperlipidemia - Primary    Recheck cholesterol labs diet hopefully we can get his LDL below 140      Relevant Medications   olmesartan (BENICAR) 20 MG tablet   Other Relevant Orders   Lipid panel   Essential hypertension, benign    Blood pressure well controlled and change medication      Relevant Medications   olmesartan (BENICAR) 20 MG tablet   Other Relevant Orders   CBC with Differential/Platelet   Comprehensive metabolic panel      Note: This dictation was prepared with Dragon dictation along with smaller phrase technology. Any transcriptional errors that result from this process are unintentional.

## 2016-02-07 NOTE — Assessment & Plan Note (Signed)
Stressful family situation. With his mother passing away and now helping to care for his father. He states he will call if he feels he needs anything for his nerves at this time he wishes, wait things out. He does have some support of other friends where his father lives.

## 2016-02-08 LAB — COMPREHENSIVE METABOLIC PANEL
ALK PHOS: 55 U/L (ref 40–115)
ALT: 15 U/L (ref 9–46)
AST: 17 U/L (ref 10–35)
Albumin: 4.1 g/dL (ref 3.6–5.1)
BUN: 14 mg/dL (ref 7–25)
CALCIUM: 9 mg/dL (ref 8.6–10.3)
CHLORIDE: 106 mmol/L (ref 98–110)
CO2: 27 mmol/L (ref 20–31)
Creat: 1.12 mg/dL (ref 0.70–1.33)
GLUCOSE: 93 mg/dL (ref 70–99)
POTASSIUM: 5.1 mmol/L (ref 3.5–5.3)
Sodium: 144 mmol/L (ref 135–146)
Total Bilirubin: 0.9 mg/dL (ref 0.2–1.2)
Total Protein: 6.6 g/dL (ref 6.1–8.1)

## 2016-02-08 LAB — CBC WITH DIFFERENTIAL/PLATELET
Basophils Absolute: 98 cells/uL (ref 0–200)
Basophils Relative: 2 %
EOS ABS: 147 {cells}/uL (ref 15–500)
Eosinophils Relative: 3 %
HEMATOCRIT: 46 % (ref 38.5–50.0)
Hemoglobin: 15.4 g/dL (ref 13.0–17.0)
LYMPHS PCT: 20 %
Lymphs Abs: 980 cells/uL (ref 850–3900)
MCH: 30.4 pg (ref 27.0–33.0)
MCHC: 33.5 g/dL (ref 32.0–36.0)
MCV: 90.9 fL (ref 80.0–100.0)
MONO ABS: 441 {cells}/uL (ref 200–950)
MONOS PCT: 9 %
MPV: 12.6 fL — AB (ref 7.5–12.5)
NEUTROS PCT: 66 %
Neutro Abs: 3234 cells/uL (ref 1500–7800)
PLATELETS: 226 10*3/uL (ref 140–400)
RBC: 5.06 MIL/uL (ref 4.20–5.80)
RDW: 14.6 % (ref 11.0–15.0)
WBC: 4.9 10*3/uL (ref 3.8–10.8)

## 2016-02-08 LAB — LIPID PANEL
CHOL/HDL RATIO: 4.8 ratio (ref <=5.0)
Cholesterol: 200 mg/dL (ref 125–200)
HDL: 42 mg/dL (ref >=40)
LDL CALC: 141 mg/dL — AB (ref <130)
Triglycerides: 87 mg/dL (ref <150)
VLDL: 17 mg/dL (ref <30)

## 2016-05-22 ENCOUNTER — Encounter: Payer: Self-pay | Admitting: Family Medicine

## 2016-07-24 ENCOUNTER — Encounter: Payer: 59 | Admitting: Family Medicine

## 2016-08-11 ENCOUNTER — Encounter: Payer: Self-pay | Admitting: Family Medicine

## 2016-08-11 ENCOUNTER — Ambulatory Visit (INDEPENDENT_AMBULATORY_CARE_PROVIDER_SITE_OTHER): Payer: 59 | Admitting: Family Medicine

## 2016-08-11 VITALS — BP 138/78 | HR 72 | Temp 97.8°F | Resp 12 | Ht 70.0 in | Wt 195.0 lb

## 2016-08-11 DIAGNOSIS — Z1159 Encounter for screening for other viral diseases: Secondary | ICD-10-CM

## 2016-08-11 DIAGNOSIS — R3129 Other microscopic hematuria: Secondary | ICD-10-CM

## 2016-08-11 DIAGNOSIS — R3911 Hesitancy of micturition: Secondary | ICD-10-CM

## 2016-08-11 DIAGNOSIS — N401 Enlarged prostate with lower urinary tract symptoms: Secondary | ICD-10-CM | POA: Diagnosis not present

## 2016-08-11 DIAGNOSIS — I10 Essential (primary) hypertension: Secondary | ICD-10-CM

## 2016-08-11 DIAGNOSIS — E7800 Pure hypercholesterolemia, unspecified: Secondary | ICD-10-CM

## 2016-08-11 DIAGNOSIS — Z0000 Encounter for general adult medical examination without abnormal findings: Secondary | ICD-10-CM

## 2016-08-11 DIAGNOSIS — Z125 Encounter for screening for malignant neoplasm of prostate: Secondary | ICD-10-CM

## 2016-08-11 DIAGNOSIS — Z1211 Encounter for screening for malignant neoplasm of colon: Secondary | ICD-10-CM

## 2016-08-11 DIAGNOSIS — Z6379 Other stressful life events affecting family and household: Secondary | ICD-10-CM

## 2016-08-11 DIAGNOSIS — N400 Enlarged prostate without lower urinary tract symptoms: Secondary | ICD-10-CM | POA: Insufficient documentation

## 2016-08-11 MED ORDER — VALSARTAN 80 MG PO TABS: 80.0000 mg | ORAL_TABLET | Freq: Every day | ORAL | 6 refills | Status: DC

## 2016-08-11 NOTE — Patient Instructions (Addendum)
Referral for colonoscopy  We will call with lab results Switched to valsartan for blood pressure  F/U 6 months

## 2016-08-11 NOTE — Assessment & Plan Note (Signed)
Declines any medication to help with stress, anxious moments, will call if any thing changes

## 2016-08-11 NOTE — Assessment & Plan Note (Signed)
Check PSA he did desires to hold off on any medications at this time. I will also check a urinalysis no sign of any inguinal hernia or infection otherwise

## 2016-08-11 NOTE — Assessment & Plan Note (Signed)
Benicar to expensive we'll change him to generic valsartan Fasting lab today
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
## 2016-08-11 NOTE — Assessment & Plan Note (Addendum)
CPE done, fasting labs, referral for colonoscopy , declines flu shot/HIV Hep C testing to be done, PSA to be done

## 2016-08-12 LAB — LIPID PANEL
CHOL/HDL RATIO: 5.9 ratio — AB (ref <=5.0)
Cholesterol: 234 mg/dL — ABNORMAL HIGH (ref 125–200)
HDL: 40 mg/dL (ref >=40)
LDL CALC: 167 mg/dL — AB (ref <130)
Triglycerides: 133 mg/dL (ref <150)
VLDL: 27 mg/dL (ref <30)

## 2016-08-12 LAB — URINALYSIS, ROUTINE W REFLEX MICROSCOPIC
BILIRUBIN URINE: NEGATIVE
Glucose, UA: NEGATIVE
HGB URINE DIPSTICK: NEGATIVE
KETONES UR: NEGATIVE
Leukocytes, UA: NEGATIVE
NITRITE: NEGATIVE
PH: 6.5 (ref 5.0–8.0)
Protein, ur: NEGATIVE
Specific Gravity, Urine: 1.015 (ref 1.001–1.035)

## 2016-08-12 LAB — CBC WITH DIFFERENTIAL/PLATELET
BASOS ABS: 59 {cells}/uL (ref 0–200)
Basophils Relative: 1 %
EOS PCT: 1 %
Eosinophils Absolute: 59 cells/uL (ref 15–500)
HCT: 43.2 % (ref 38.5–50.0)
Hemoglobin: 14.8 g/dL (ref 13.0–17.0)
LYMPHS ABS: 1239 {cells}/uL (ref 850–3900)
LYMPHS PCT: 21 %
MCH: 30.3 pg (ref 27.0–33.0)
MCHC: 34.3 g/dL (ref 32.0–36.0)
MCV: 88.5 fL (ref 80.0–100.0)
MONO ABS: 413 {cells}/uL (ref 200–950)
MONOS PCT: 7 %
MPV: 10.3 fL (ref 7.5–12.5)
NEUTROS PCT: 70 %
Neutro Abs: 4130 cells/uL (ref 1500–7800)
PLATELETS: 217 10*3/uL (ref 140–400)
RBC: 4.88 MIL/uL (ref 4.20–5.80)
RDW: 14.4 % (ref 11.0–15.0)
WBC: 5.9 10*3/uL (ref 3.8–10.8)

## 2016-08-12 LAB — COMPREHENSIVE METABOLIC PANEL
ALT: 16 U/L (ref 9–46)
AST: 19 U/L (ref 10–35)
Albumin: 4.2 g/dL (ref 3.6–5.1)
Alkaline Phosphatase: 76 U/L (ref 40–115)
BUN: 11 mg/dL (ref 7–25)
CHLORIDE: 102 mmol/L (ref 98–110)
CO2: 28 mmol/L (ref 20–31)
CREATININE: 1.02 mg/dL (ref 0.70–1.33)
Calcium: 9.3 mg/dL (ref 8.6–10.3)
Glucose, Bld: 81 mg/dL (ref 70–99)
POTASSIUM: 3.8 mmol/L (ref 3.5–5.3)
SODIUM: 138 mmol/L (ref 135–146)
Total Bilirubin: 1.7 mg/dL — ABNORMAL HIGH (ref 0.2–1.2)
Total Protein: 6.7 g/dL (ref 6.1–8.1)

## 2016-08-12 LAB — PSA: PSA: 1.2 ng/mL (ref <=4.0)

## 2016-08-12 LAB — HEPATITIS C ANTIBODY: HCV AB: NEGATIVE

## 2016-08-13 LAB — URINE CULTURE: Organism ID, Bacteria: NO GROWTH

## 2016-08-15 ENCOUNTER — Encounter: Payer: Self-pay | Admitting: Gastroenterology

## 2016-09-29 ENCOUNTER — Ambulatory Visit (AMBULATORY_SURGERY_CENTER): Payer: Self-pay

## 2016-09-29 VITALS — Ht 71.0 in | Wt 197.2 lb

## 2016-09-29 DIAGNOSIS — Z1211 Encounter for screening for malignant neoplasm of colon: Secondary | ICD-10-CM

## 2016-09-29 MED ORDER — SUPREP BOWEL PREP KIT 17.5-3.13-1.6 GM/177ML PO SOLN: 1.0000 | Freq: Once | ORAL | 0 refills | Status: AC

## 2016-09-29 NOTE — Progress Notes (Signed)
No allergies to eggs or soy No past problems with anesthesia No diet meds No home oxygen  registered for emmi

## 2016-10-02 ENCOUNTER — Encounter: Payer: Self-pay | Admitting: Gastroenterology

## 2016-10-13 ENCOUNTER — Encounter: Payer: Self-pay | Admitting: Gastroenterology

## 2016-10-13 ENCOUNTER — Ambulatory Visit (AMBULATORY_SURGERY_CENTER): Payer: 59 | Admitting: Gastroenterology

## 2016-10-13 VITALS — BP 111/72 | HR 50 | Temp 95.3°F | Resp 18 | Ht 71.0 in | Wt 197.0 lb

## 2016-10-13 DIAGNOSIS — Z1212 Encounter for screening for malignant neoplasm of rectum: Secondary | ICD-10-CM | POA: Diagnosis not present

## 2016-10-13 DIAGNOSIS — D123 Benign neoplasm of transverse colon: Secondary | ICD-10-CM

## 2016-10-13 DIAGNOSIS — Z1211 Encounter for screening for malignant neoplasm of colon: Secondary | ICD-10-CM | POA: Diagnosis present

## 2016-10-13 MED ORDER — SODIUM CHLORIDE 0.9 % IV SOLN: 500.0000 mL | INTRAVENOUS | Status: AC

## 2016-10-13 NOTE — Progress Notes (Signed)
Called to room to assist during endoscopic procedure.  Patient ID and intended procedure confirmed with present staff. Received instructions for my participation in the procedure from the performing physician.  

## 2016-10-13 NOTE — Progress Notes (Signed)
A/ox3 pleased with MAC, report to Penny RN 

## 2016-10-13 NOTE — Op Note (Signed)
[place] Patient Name: Juan Hoover Procedure Date: 10/13/2016 8:33 AM MRN: [id number] Endoscopist: Milus Banister , MD Age: 55 Referring MD:  Date of Birth: [birth year] Gender: Male Account #: [id number] Procedure:                Colonoscopy Indications:              Screening for colorectal malignant neoplasm Medicines:                Monitored Anesthesia Care Procedure:                Pre-Anesthesia Assessment:                           - Prior to the procedure, a History and Physical                            was performed, and patient medications and                            allergies were reviewed. The patient's tolerance of                            previous anesthesia was also reviewed. The risks                            and benefits of the procedure and the sedation                            options and risks were discussed with the patient.                            All questions were answered, and informed consent                            was obtained. Prior Anticoagulants: The patient has                            taken no previous anticoagulant or antiplatelet                            agents. ASA Grade Assessment: II - A patient with                            mild systemic disease. After reviewing the risks                            and benefits, the patient was deemed in                            satisfactory condition to undergo the procedure.                           After obtaining informed consent, the colonoscope  was passed under direct vision. Throughout the                            procedure, the patient's blood pressure, pulse, and                            oxygen saturations were monitored continuously. The                            Model PCF-[id number]) scope was introduced                            through the anus and advanced to the the cecum,                            identified by  appendiceal orifice and ileocecal                            valve. The colonoscopy was performed without                            difficulty. The patient tolerated the procedure                            well. The quality of the bowel preparation was                            excellent. The ileocecal valve, appendiceal                            orifice, and rectum were photographed. Scope In: 8:36:55 AM Scope Out: 8:52:35 AM Scope Withdrawal Time: 0 hours 13 minutes 2 seconds  Total Procedure Duration: 0 hours 15 minutes 40 seconds  Findings:                 A 3 mm polyp was found in the transverse colon. The                            polyp was sessile. The polyp was removed with a                            cold snare. Resection and retrieval were complete.                           The exam was otherwise without abnormality on                            direct and retroflexion views. Complications:            No immediate complications. Estimated blood loss:                            None. Estimated Blood Loss:     Estimated blood loss: none. Impression:               -  One 3 mm polyp in the transverse colon, removed                            with a cold snare. Resected and retrieved.                           - The examination was otherwise normal on direct                            and retroflexion views. Recommendation:           - Patient has a contact number available for                            emergencies. The signs and symptoms of potential                            delayed complications were discussed with the                            patient. Return to normal activities tomorrow.                            Written discharge instructions were provided to the                            patient.                           - Resume previous diet.                           - Continue present medications.                           You will receive a letter within  2-3 weeks with the                            pathology results and my final recommendations.                           If the polyp(s) is proven to be 'pre-cancerous' on                            pathology, you will need repeat colonoscopy in 5                            years. If the polyp(s) is NOT 'precancerous' on                            pathology then you should repeat colon cancer                            screening in 10 years with colonoscopy without need  for colon cancer screening by any method prior to                            then (including stool testing). Milus Banister, MD 10/13/2016 8:55:47 AM This report has been signed electronically.

## 2016-10-13 NOTE — Patient Instructions (Signed)
YOU HAD AN ENDOSCOPIC PROCEDURE TODAY AT THE [facility]:   Refer to the procedure report that was given to you for any specific questions about what was found during the examination.  If the procedure report does not answer your questions, please call your gastroenterologist to clarify.  If you requested that your care partner not be given the details of your procedure findings, then the procedure report has been included in a sealed envelope for you to review at your convenience later.  YOU SHOULD EXPECT: Some feelings of bloating in the abdomen. Passage of more gas than usual.  Walking can help get rid of the air that was put into your GI tract during the procedure and reduce the bloating. If you had a lower endoscopy (such as a colonoscopy or flexible sigmoidoscopy) you may notice spotting of blood in your stool or on the toilet paper. If you underwent a bowel prep for your procedure, you may not have a normal bowel movement for a few days.  Please Note:  You might notice some irritation and congestion in your nose or some drainage.  This is from the oxygen used during your procedure.  There is no need for concern and it should clear up in a day or so.  SYMPTOMS TO REPORT IMMEDIATELY:   Following lower endoscopy (colonoscopy or flexible sigmoidoscopy):  Excessive amounts of blood in the stool  Significant tenderness or worsening of abdominal pains  Swelling of the abdomen that is new, acute  Fever of 100F or higher    For urgent or emergent issues, a gastroenterologist can be reached at any hour by calling [phone].   DIET:  We do recommend a small meal at first, but then you may proceed to your regular diet.  Drink plenty of fluids but you should avoid alcoholic beverages for 24 hours.  ACTIVITY:  You should plan to take it easy for the rest of today and you should NOT DRIVE or use heavy machinery until tomorrow (because of the sedation medicines used during the test).     FOLLOW UP: Our staff will call the number listed on your records the next business day following your procedure to check on you and address any questions or concerns that you may have regarding the information given to you following your procedure. If we do not reach you, we will leave a message.  However, if you are feeling well and you are not experiencing any problems, there is no need to return our call.  We will assume that you have returned to your regular daily activities without incident.  If any biopsies were taken you will be contacted by phone or by letter within the next 1-3 weeks.  Please call us at [phone] if you have not heard about the biopsies in 3 weeks.    SIGNATURES/CONFIDENTIALITY: You and/or your care partner have signed paperwork which will be entered into your electronic medical record.  These signatures attest to the fact that that the information above on your After Visit Summary has been reviewed and is understood.  Full responsibility of the confidentiality of this discharge information lies with you and/or your care-partner.   Information on polyps given to you today   Await pathology results  

## 2016-10-16 ENCOUNTER — Telehealth: Payer: Self-pay | Admitting: *Deleted

## 2016-10-16 NOTE — Telephone Encounter (Signed)
  Follow up Call-  Call back number 10/13/2016  Post procedure Call Back phone  # [phone]  Permission to leave phone message Yes  Some recent data might be hidden     Patient questions:  Do you have a fever, pain , or abdominal swelling? No. Pain Score  0 *  Have you tolerated food without any problems? Yes.    Have you been able to return to your normal activities? Yes.    Do you have any questions about your discharge instructions: Diet   No. Medications  No. Follow up visit  No.  Do you have questions or concerns about your Care? No.  Actions: * If pain score is 4 or above: No action needed, pain <4.

## 2016-10-20 ENCOUNTER — Encounter: Payer: Self-pay | Admitting: Gastroenterology

## 2017-02-19 ENCOUNTER — Ambulatory Visit: Payer: Self-pay | Admitting: Family Medicine

## 2017-05-01 ENCOUNTER — Other Ambulatory Visit: Payer: Self-pay | Admitting: Family Medicine

## 2017-06-06 ENCOUNTER — Encounter: Payer: Self-pay | Admitting: Family Medicine

## 2017-06-06 ENCOUNTER — Ambulatory Visit (INDEPENDENT_AMBULATORY_CARE_PROVIDER_SITE_OTHER): Payer: 59 | Admitting: Family Medicine

## 2017-06-06 VITALS — BP 132/84 | HR 70 | Temp 98.6°F | Resp 14 | Ht 71.0 in | Wt 201.0 lb

## 2017-06-06 DIAGNOSIS — I10 Essential (primary) hypertension: Secondary | ICD-10-CM

## 2017-06-06 DIAGNOSIS — E7800 Pure hypercholesterolemia, unspecified: Secondary | ICD-10-CM | POA: Diagnosis not present

## 2017-06-06 DIAGNOSIS — R0789 Other chest pain: Secondary | ICD-10-CM

## 2017-06-06 LAB — CBC WITH DIFFERENTIAL/PLATELET
BASOS PCT: 1 %
Basophils Absolute: 45 cells/uL (ref 0–200)
EOS ABS: 90 {cells}/uL (ref 15–500)
Eosinophils Relative: 2 %
HEMATOCRIT: 47.1 % (ref 38.5–50.0)
Hemoglobin: 15.8 g/dL (ref 13.0–17.0)
LYMPHS PCT: 23 %
Lymphs Abs: 1035 cells/uL (ref 850–3900)
MCH: 30.4 pg (ref 27.0–33.0)
MCHC: 33.5 g/dL (ref 32.0–36.0)
MCV: 90.8 fL (ref 80.0–100.0)
MONO ABS: 495 {cells}/uL (ref 200–950)
MPV: 10.8 fL (ref 7.5–12.5)
Monocytes Relative: 11 %
Neutro Abs: 2835 cells/uL (ref 1500–7800)
Neutrophils Relative %: 63 %
Platelets: 198 10*3/uL (ref 140–400)
RBC: 5.19 MIL/uL (ref 4.20–5.80)
RDW: 14.3 % (ref 11.0–15.0)
WBC: 4.5 10*3/uL (ref 3.8–10.8)

## 2017-06-06 MED ORDER — LOSARTAN POTASSIUM 50 MG PO TABS: 50.0000 mg | ORAL_TABLET | Freq: Every day | ORAL | 6 refills | Status: DC

## 2017-06-06 NOTE — Assessment & Plan Note (Signed)
We'll switch him to losartan 50 mg once a day. Discussed his episodes of chest pain sounds more atypical as he does not have any problems with exertion is very short lived. He does have some risk factor however for heart disease especially with his blood pressure and some family history. He's had stress tests in the past which follows come back normal per her report. At this time he does not want to see cardiology. Do not see any muscle skeletal problem on exam. There is possible that some of the anxiety is causing his symptoms as well but he is not correlate it with any particular event at work or home recently. He just wants to monitor for now

## 2017-06-06 NOTE — Progress Notes (Signed)
Subjective:    [name], male    DOB: 1961/09/17, 56 y.o.   MRN: [id number]  Patient presents for Follow-up (is fasting) and Medication Management (currently on valsartan and needs to change d/t losartan)  Is here follow-up chronic medical problems. He's been monitored for hypertension and hyperlipidemia. He's taken his valsartan as prescribed he was switched to the last visit as his insurance did not cover the Olmesartan. His valsartan has been recalled and he needs to change to a different medication. His cholesterol had increased at his last check in October 2017 total cholesterol was 234 LDL 167 due to repeat  He does occasionally have random episodes of sharp left-sided chest pain. He does not having pain when he is exerting himself at work which he is very physical job. He does not have any tingling or numbness down his arm. States this has been going on and off since last year was worse when his stress levels are very high having to take care of his father but states his father's assisted living facility. He also gets stressed at work only when he is physically in the office visit. However is out in the community which he enjoys. He did have a injury to his left shoulder in the past but is able to use it as normally and does not get any pain when he is doing his activities. He has not had any nausea or diaphoresis or shortness of breath when he does get the random pain states only last for a couple of seconds. The last occurred while he was driving > a fewmonths ago  Review Of Systems:  GEN- denies fatigue, fever, weight loss,weakness, recent illness HEENT- denies eye drainage, change in vision, nasal discharge, CVS- +chest pain, denies palpitations RESP- denies SOB, cough, wheeze ABD- denies N/V, change in stools, abd pain GU- denies dysuria, hematuria, dribbling, incontinence MSK- denies joint pain, muscle aches, injury Neuro- denies headache, dizziness, syncope,  seizure activity       Objective:    BP 132/84   Pulse 70   Temp 98.6 F (37 C) (Oral)   Resp 14   Ht 5\' 11"  (1.803 m)   Wt 201 lb (91.2 kg)   SpO2 98%   BMI 28.03 kg/m  GEN- NAD, alert and oriented x3 HEENT- PERRL, EOMI, non injected sclera, pink conjunctiva, MMM, oropharynx clear CVS- RRR, no murmur RESP-CTAB Psych - normal affect and mood  MSK- FROM UPPER EXT,. Normal strength UE EXT- No edema Pulses- Radial  2+        Assessment & Plan:      Problem List Items Addressed This Visit    Hyperlipidemia - Primary    Recheck lipids       Relevant Medications   losartan (COZAAR) 50 MG tablet   Other Relevant Orders   Lipid panel   Essential hypertension, benign    We'll switch him to losartan 50 mg once a day. Discussed his episodes of chest pain sounds more atypical as he does not have any problems with exertion is very short lived. He does have some risk factor however for heart disease especially with his blood pressure and some family history. He's had stress tests in the past which follows come back normal per her report. At this time he does not want to see cardiology. Do not see any muscle skeletal problem on exam. There is possible that some of the anxiety is causing his symptoms as well but he  is not correlate it with any particular event at work or home recently. He just wants to monitor for now      Relevant Medications   losartan (COZAAR) 50 MG tablet   Other Relevant Orders   CBC with Differential/Platelet   Comprehensive metabolic panel    Other Visit Diagnoses    Chest pain, atypical          Note: This dictation was prepared with Dragon dictation along with smaller phrase technology. Any transcriptional errors that result from this process are unintentional.

## 2017-06-06 NOTE — Assessment & Plan Note (Signed)
Recheck lipids

## 2017-06-06 NOTE — Patient Instructions (Addendum)
F/U Physical 6 months

## 2017-06-07 LAB — COMPREHENSIVE METABOLIC PANEL
ALBUMIN: 4.4 g/dL (ref 3.6–5.1)
ALK PHOS: 71 U/L (ref 40–115)
ALT: 21 U/L (ref 9–46)
AST: 20 U/L (ref 10–35)
BUN: 14 mg/dL (ref 7–25)
CALCIUM: 9.6 mg/dL (ref 8.6–10.3)
CO2: 22 mmol/L (ref 20–32)
Chloride: 105 mmol/L (ref 98–110)
Creat: 1.08 mg/dL (ref 0.70–1.33)
GLUCOSE: 102 mg/dL — AB (ref 70–99)
POTASSIUM: 4.1 mmol/L (ref 3.5–5.3)
Sodium: 141 mmol/L (ref 135–146)
TOTAL PROTEIN: 6.7 g/dL (ref 6.1–8.1)
Total Bilirubin: 1.4 mg/dL — ABNORMAL HIGH (ref 0.2–1.2)

## 2017-06-07 LAB — LIPID PANEL
CHOL/HDL RATIO: 5.6 ratio — AB (ref <5.0)
CHOLESTEROL: 242 mg/dL — AB (ref <200)
HDL: 43 mg/dL (ref >40)
LDL Cholesterol: 179 mg/dL — ABNORMAL HIGH (ref <100)
TRIGLYCERIDES: 102 mg/dL (ref <150)
VLDL: 20 mg/dL (ref <30)

## 2017-12-10 ENCOUNTER — Ambulatory Visit (INDEPENDENT_AMBULATORY_CARE_PROVIDER_SITE_OTHER): Payer: 59 | Admitting: Family Medicine

## 2017-12-10 ENCOUNTER — Other Ambulatory Visit: Payer: Self-pay

## 2017-12-10 ENCOUNTER — Encounter: Payer: Self-pay | Admitting: Family Medicine

## 2017-12-10 VITALS — BP 138/88 | HR 68 | Temp 98.2°F | Resp 16 | Ht 71.0 in | Wt 209.0 lb

## 2017-12-10 DIAGNOSIS — E7800 Pure hypercholesterolemia, unspecified: Secondary | ICD-10-CM

## 2017-12-10 DIAGNOSIS — I10 Essential (primary) hypertension: Secondary | ICD-10-CM | POA: Diagnosis not present

## 2017-12-10 DIAGNOSIS — J069 Acute upper respiratory infection, unspecified: Secondary | ICD-10-CM | POA: Diagnosis not present

## 2017-12-10 DIAGNOSIS — C61 Malignant neoplasm of prostate: Secondary | ICD-10-CM | POA: Diagnosis not present

## 2017-12-10 DIAGNOSIS — Z0000 Encounter for general adult medical examination without abnormal findings: Secondary | ICD-10-CM | POA: Diagnosis not present

## 2017-12-10 DIAGNOSIS — Z125 Encounter for screening for malignant neoplasm of prostate: Secondary | ICD-10-CM

## 2017-12-10 NOTE — Patient Instructions (Addendum)
Schedule nurse visit for shingles shot  We will call with lab results  F/U 6 months

## 2017-12-10 NOTE — Progress Notes (Signed)
   Subjective:    [name], male    DOB: 1961-04-13, 57 y.o.   MRN: [id number]  Patient presents for CPE (is fasting) and Illness (x5 days- sinus drainage, post nasal drip, sneezing, chest congestion)  Pt here for CPE Medications reviewed Colonoscopy- UTD Immunizations- TDAP UTD, Declines FLU, Shingles Due for fasting labs Discussed PSA testing  Sinus pressure, drainage for past week , congestion for past,m with some cough, no fever. Taking daqyquil and nyquil , already improving   HTN- taking losartan as prescribed   Will see eye doctor this year - routine visit   Review Of Systems:  GEN- denies fatigue, fever, weight loss,weakness, recent illness HEENT- denies eye drainage, change in vision, +nasal discharge, CVS- denies chest pain, palpitations RESP- denies SOB, cough, wheeze ABD- denies N/V, change in stools, abd pain GU- denies dysuria, hematuria, dribbling, incontinence MSK- denies joint pain, muscle aches, injury Neuro- denies headache, dizziness, syncope, seizure activity       Objective:    BP 138/88   Pulse 68   Temp 98.2 F (36.8 C) (Oral)   Resp 16   Ht 5\' 11"  (1.803 m)   Wt 209 lb (94.8 kg)   SpO2 98%   BMI 29.15 kg/m  GEN- NAD, alert and oriented x3 HEENT- PERRL, EOMI, non injected sclera, pink conjunctiva, MMM, oropharynx clear, no maxillary sinus tenderness, TM clear bilat, no effusion, nares clear rhinorrhea, enlarged turbinates  Neck- Supple, no thyromegaly, no LAD CVS- RRR, no murmur RESP-CTAB ABD-NABS,soft,NT,ND EXT- No edema Pulses- Radial, DP- 2+        Assessment & Plan:      Problem List Items Addressed This Visit      Unprioritized   Hyperlipidemia   Essential hypertension, benign   Relevant Orders   Lipid panel   Routine general medical examination at a health care facility - Primary    CPE done, fasting labs, recommend Eye and dental visits He will check on cost of shingles vaccine Declines  flu Discussed PSA testing, will proceed  Blood pressure controlled no changes  URI-  He declines use of nasal sprays, symptoms improving, viral illness      Relevant Orders   CBC with Differential/Platelet   Comprehensive metabolic panel    Other Visit Diagnoses    Prostate cancer screening       Relevant Orders   PSA   Acute URI          Note: This dictation was prepared with Dragon dictation along with smaller phrase technology. Any transcriptional errors that result from this process are unintentional.

## 2017-12-10 NOTE — Assessment & Plan Note (Signed)
CPE done, fasting labs, recommend Eye and dental visits He will check on cost of shingles vaccine Declines flu Discussed PSA testing, will proceed  Blood pressure controlled no changes  URI-  He declines use of nasal sprays, symptoms improving, viral illness

## 2017-12-11 LAB — CBC WITH DIFFERENTIAL/PLATELET
BASOS ABS: 53 {cells}/uL (ref 0–200)
Basophils Relative: 1.1 %
EOS PCT: 3.6 %
Eosinophils Absolute: 173 cells/uL (ref 15–500)
HCT: 44.2 % (ref 38.5–50.0)
Hemoglobin: 15.3 g/dL (ref 13.2–17.1)
Lymphs Abs: 696 cells/uL — ABNORMAL LOW (ref 850–3900)
MCH: 30.6 pg (ref 27.0–33.0)
MCHC: 34.6 g/dL (ref 32.0–36.0)
MCV: 88.4 fL (ref 80.0–100.0)
MONOS PCT: 10.1 %
MPV: 11.3 fL (ref 7.5–12.5)
Neutro Abs: 3394 cells/uL (ref 1500–7800)
Neutrophils Relative %: 70.7 %
PLATELETS: 193 10*3/uL (ref 140–400)
RBC: 5 10*6/uL (ref 4.20–5.80)
RDW: 13.2 % (ref 11.0–15.0)
TOTAL LYMPHOCYTE: 14.5 %
WBC mixed population: 485 cells/uL (ref 200–950)
WBC: 4.8 10*3/uL (ref 3.8–10.8)

## 2017-12-11 LAB — COMPREHENSIVE METABOLIC PANEL
AG Ratio: 1.7 (calc) (ref 1.0–2.5)
ALT: 24 U/L (ref 9–46)
AST: 18 U/L (ref 10–35)
Albumin: 4.2 g/dL (ref 3.6–5.1)
Alkaline phosphatase (APISO): 73 U/L (ref 40–115)
BILIRUBIN TOTAL: 1.1 mg/dL (ref 0.2–1.2)
BUN: 11 mg/dL (ref 7–25)
CALCIUM: 9.4 mg/dL (ref 8.6–10.3)
CO2: 30 mmol/L (ref 20–32)
CREATININE: 1.01 mg/dL (ref 0.70–1.33)
Chloride: 106 mmol/L (ref 98–110)
Globulin: 2.5 g/dL (calc) (ref 1.9–3.7)
Glucose, Bld: 96 mg/dL (ref 65–99)
Potassium: 4.5 mmol/L (ref 3.5–5.3)
SODIUM: 141 mmol/L (ref 135–146)
TOTAL PROTEIN: 6.7 g/dL (ref 6.1–8.1)

## 2017-12-11 LAB — LIPID PANEL
CHOL/HDL RATIO: 4.9 (calc) (ref <5.0)
Cholesterol: 196 mg/dL (ref <200)
HDL: 40 mg/dL — ABNORMAL LOW (ref >40)
LDL CHOLESTEROL (CALC): 133 mg/dL — AB
Non-HDL Cholesterol (Calc): 156 mg/dL (calc) — ABNORMAL HIGH (ref <130)
Triglycerides: 124 mg/dL (ref <150)

## 2017-12-11 LAB — PSA: PSA: 1.5 ng/mL (ref <=4.0)

## 2018-03-11 ENCOUNTER — Other Ambulatory Visit: Payer: Self-pay | Admitting: Family Medicine

## 2018-06-10 ENCOUNTER — Encounter: Payer: Self-pay | Admitting: Family Medicine

## 2018-06-10 ENCOUNTER — Other Ambulatory Visit: Payer: Self-pay

## 2018-06-10 ENCOUNTER — Ambulatory Visit: Payer: 59 | Admitting: Family Medicine

## 2018-06-10 VITALS — BP 132/68 | HR 64 | Temp 98.5°F | Resp 12 | Ht 71.0 in | Wt 216.0 lb

## 2018-06-10 DIAGNOSIS — I10 Essential (primary) hypertension: Secondary | ICD-10-CM

## 2018-06-10 DIAGNOSIS — R42 Dizziness and giddiness: Secondary | ICD-10-CM

## 2018-06-10 DIAGNOSIS — R635 Abnormal weight gain: Secondary | ICD-10-CM | POA: Diagnosis not present

## 2018-06-10 MED ORDER — LOSARTAN POTASSIUM 50 MG PO TABS: ORAL_TABLET | ORAL | 2 refills | Status: DC

## 2018-06-10 NOTE — Assessment & Plan Note (Addendum)
Blood pressure is controlled we will recheck his labs.  He has gained some weight which she states is unintentional.  We will check a TSH with his dizzy symptoms as well.  I think that he may be overeating once he gets home as he does not like to eat while he is at work.  Neurologically everything appears to be intact otherwise.  He has not had any syncopal events.  He is having intermittent chest pain from a few years now.  We have discussed multiple times going to cardiology in the next that would be stress testing he states that he has had this in his 80s and it was normal.  He does not want to proceed cardiology at this time.  5 him to eat something during the day as well to see if this helps with his symptoms he is keeping hydrated

## 2018-06-10 NOTE — Patient Instructions (Addendum)
F/U Feb for physical We will call with lab results Eat during the day

## 2018-06-10 NOTE — Progress Notes (Signed)
   Subjective:    [name], male    DOB: Feb 28, 1961, 57 y.o.   MRN: [id number]  Patient presents for Follow-up (is fasting)   Pt here to f/u chronic medical problems  Medications reviewed  HTN- taking BP meds as prescribed, no concerns Labs improved, especially cholesterol at CPE in Feb    Has been a little dizzy past few days. No fever, no URI symptoms. Had arms up sitting on tires and felt so dizzy felt like he was going to pass out Has been drinking more water and gaterade Still gets intermittent chest pains, but not pressure, not going down his arms  Has gained weight this summer no real change in eating habits      Review Of Systems:  GEN- denies fatigue, fever, weight loss,weakness, recent illness HEENT- denies eye drainage, change in vision, nasal discharge, CVS- denies chest pain, palpitations RESP- denies SOB, cough, wheeze ABD- denies N/V, change in stools, abd pain GU- denies dysuria, hematuria, dribbling, incontinence MSK- denies joint pain, muscle aches, injury Neuro- denies headache, +dizziness, syncope, seizure activity       Objective:    BP 132/68   Pulse 64   Temp 98.5 F (36.9 C) (Oral)   Resp 12   Ht 5\' 11"  (1.803 m)   Wt 216 lb (98 kg)   SpO2 97%   BMI 30.13 kg/m  GEN- NAD, alert and oriented x3, well appearing  HEENT- PERRL, EOMI, non injected sclera, pink conjunctiva, MMM, oropharynx clear, no bruit  Neck- Supple, no thyromegaly CVS- RRR, no murmur RESP-CTAB ABD-NABS,soft,NT,ND Neuro-CNII-XII in tact no focal deficits  EXT- No edema Pulses- Radial, DP- 2+        Assessment & Plan:      Problem List Items Addressed This Visit      Unprioritized   Essential hypertension, benign - Primary    Blood pressure is controlled we will recheck his labs.  He has gained some weight which she states is unintentional.  We will check a TSH with his dizzy symptoms as well.  I think that he may be overeating once he gets home as  he does not like to eat while he is at work.  Neurologically everything appears to be intact otherwise.  He has not had any syncopal events.  He is having intermittent chest pain from a few years now.  We have discussed multiple times going to cardiology in the next that would be stress testing he states that he has had this in his 40s and it was normal.  He does not want to proceed cardiology at this time.  5 him to eat something during the day as well to see if this helps with his symptoms he is keeping hydrated      Relevant Orders   CBC with Differential/Platelet   Comprehensive metabolic panel   TSH    Other Visit Diagnoses    Dizziness       Relevant Orders   TSH   Weight gain       Relevant Orders   TSH      Note: This dictation was prepared with Dragon dictation along with smaller phrase technology. Any transcriptional errors that result from this process are unintentional.

## 2018-06-11 LAB — COMPREHENSIVE METABOLIC PANEL
AG RATIO: 1.8 (calc) (ref 1.0–2.5)
ALT: 24 U/L (ref 9–46)
AST: 20 U/L (ref 10–35)
Albumin: 4.4 g/dL (ref 3.6–5.1)
Alkaline phosphatase (APISO): 74 U/L (ref 40–115)
BUN: 15 mg/dL (ref 7–25)
CALCIUM: 9.4 mg/dL (ref 8.6–10.3)
CHLORIDE: 105 mmol/L (ref 98–110)
CO2: 26 mmol/L (ref 20–32)
Creat: 1.1 mg/dL (ref 0.70–1.33)
GLOBULIN: 2.4 g/dL (ref 1.9–3.7)
GLUCOSE: 101 mg/dL — AB (ref 65–99)
Potassium: 4.8 mmol/L (ref 3.5–5.3)
Sodium: 141 mmol/L (ref 135–146)
Total Bilirubin: 1.1 mg/dL (ref 0.2–1.2)
Total Protein: 6.8 g/dL (ref 6.1–8.1)

## 2018-06-11 LAB — CBC WITH DIFFERENTIAL/PLATELET
BASOS ABS: 53 {cells}/uL (ref 0–200)
BASOS PCT: 1 %
Eosinophils Absolute: 133 cells/uL (ref 15–500)
Eosinophils Relative: 2.5 %
HCT: 46.6 % (ref 38.5–50.0)
HEMOGLOBIN: 15.7 g/dL (ref 13.2–17.1)
Lymphs Abs: 1049 cells/uL (ref 850–3900)
MCH: 30.3 pg (ref 27.0–33.0)
MCHC: 33.7 g/dL (ref 32.0–36.0)
MCV: 90 fL (ref 80.0–100.0)
MONOS PCT: 9.7 %
MPV: 11.4 fL (ref 7.5–12.5)
NEUTROS ABS: 3551 {cells}/uL (ref 1500–7800)
Neutrophils Relative %: 67 %
Platelets: 221 10*3/uL (ref 140–400)
RBC: 5.18 10*6/uL (ref 4.20–5.80)
RDW: 13.3 % (ref 11.0–15.0)
TOTAL LYMPHOCYTE: 19.8 %
WBC: 5.3 10*3/uL (ref 3.8–10.8)
WBCMIX: 514 {cells}/uL (ref 200–950)

## 2018-06-11 LAB — TSH: TSH: 1.29 mIU/L (ref 0.40–4.50)

## 2018-06-12 ENCOUNTER — Encounter: Payer: Self-pay | Admitting: *Deleted

## 2018-10-10 ENCOUNTER — Telehealth: Payer: Self-pay | Admitting: Family Medicine

## 2018-10-10 MED ORDER — OSELTAMIVIR PHOSPHATE 75 MG PO CAPS: 75.0000 mg | ORAL_CAPSULE | Freq: Every day | ORAL | 0 refills | Status: DC

## 2018-10-10 NOTE — Telephone Encounter (Signed)
Prescription sent to pharmacy.

## 2018-10-10 NOTE — Telephone Encounter (Signed)
pts son was dx with influenza type b. Pt calling to request tamiflu to [facility].

## 2018-10-10 NOTE — Telephone Encounter (Signed)
tamiflu 75 mg poqd x 10

## 2018-10-10 NOTE — Telephone Encounter (Signed)
Ok to send prophylactic dose?

## 2018-12-17 ENCOUNTER — Ambulatory Visit (INDEPENDENT_AMBULATORY_CARE_PROVIDER_SITE_OTHER): Payer: 59 | Admitting: Family Medicine

## 2018-12-17 ENCOUNTER — Encounter: Payer: Self-pay | Admitting: Family Medicine

## 2018-12-17 ENCOUNTER — Other Ambulatory Visit: Payer: Self-pay

## 2018-12-17 VITALS — BP 128/70 | HR 66 | Temp 98.2°F | Resp 14 | Ht 71.0 in | Wt 216.0 lb

## 2018-12-17 DIAGNOSIS — I10 Essential (primary) hypertension: Secondary | ICD-10-CM | POA: Diagnosis not present

## 2018-12-17 DIAGNOSIS — R5383 Other fatigue: Secondary | ICD-10-CM | POA: Diagnosis not present

## 2018-12-17 DIAGNOSIS — M79605 Pain in left leg: Secondary | ICD-10-CM

## 2018-12-17 DIAGNOSIS — E7800 Pure hypercholesterolemia, unspecified: Secondary | ICD-10-CM | POA: Diagnosis not present

## 2018-12-17 DIAGNOSIS — Z125 Encounter for screening for malignant neoplasm of prostate: Secondary | ICD-10-CM

## 2018-12-17 DIAGNOSIS — Z0000 Encounter for general adult medical examination without abnormal findings: Secondary | ICD-10-CM

## 2018-12-17 DIAGNOSIS — M79604 Pain in right leg: Secondary | ICD-10-CM

## 2018-12-17 MED ORDER — ZOSTER VAC RECOMB ADJUVANTED 50 MCG/0.5ML IM SUSR: 0.5000 mL | Freq: Once | INTRAMUSCULAR | 1 refills | Status: AC

## 2018-12-17 MED ORDER — LOSARTAN POTASSIUM 50 MG PO TABS: ORAL_TABLET | ORAL | 2 refills | Status: DC

## 2018-12-17 NOTE — Assessment & Plan Note (Signed)
CPE done, shingles vaccine sent to pharmacy PSA screening to be done

## 2018-12-17 NOTE — Addendum Note (Signed)
Addended by: Sheral Flow on: 12/17/2018 11:30 AM   Modules accepted: Orders

## 2018-12-17 NOTE — Assessment & Plan Note (Signed)
Likely MTF, will check testosterone level CBC/Metabolic panel Not depressed appearing Feet pain, normal exam, no sign of neuropathy at this time, but will check glucose, no classic symptoms from his back

## 2018-12-17 NOTE — Assessment & Plan Note (Signed)
Well controlled no changes 

## 2018-12-17 NOTE — Progress Notes (Signed)
   Subjective:    [name], male    DOB: 07/16/1961, 58 y.o.   MRN: [id number]  Patient presents for Annual Exam (is fasting)   Pt here for CPE  Medications reviewed   He has had some leg pains, feels fatigued. Feet hurt first thing in the morning as well until he gets going.  He denies any back pain no injury to his legs or knees.  He does not take any medication as it does not last long.   Eating regulary throughout the day, so dizzy spells have resolved    Due for PSA screening   Colonoscopy UTD     Immunizations- TDAP UTD, declines flu shot      Hypertension taking losartan as prescribed no difficulties with the medications  Review Of Systems:  GEN- denies fatigue, fever, weight loss,weakness, recent illness HEENT- denies eye drainage, change in vision, nasal discharge, CVS- denies chest pain, palpitations RESP- denies SOB, cough, wheeze ABD- denies N/V, change in stools, abd pain GU- denies dysuria, hematuria, dribbling, incontinence MSK- denies joint pain, muscle aches, injury Neuro- denies headache, dizziness, syncope, seizure activity       Objective:    BP 128/70   Pulse 66   Temp 98.2 F (36.8 C) (Oral)   Resp 14   Ht 5\' 11"  (1.803 m)   Wt 216 lb (98 kg)   SpO2 98%   BMI 30.13 kg/m  GEN- NAD, alert and oriented x3 HEENT- PERRL, EOMI, non injected sclera, pink conjunctiva, MMM, oropharynx clear Neck- Supple, no thyromegaly CVS- RRR, no murmur RESP-CTAB ABD-NABS,soft,NT,ND EXT- No edema MSK- FROM Lower ext Pulses- Radial, DP- 2+        Assessment & Plan:      Problem List Items Addressed This Visit      Unprioritized   Essential hypertension, benign    Well controlled no changes      Relevant Medications   losartan (COZAAR) 50 MG tablet   Other Relevant Orders   Lipid panel   Fatigue    Likely MTF, will check testosterone level CBC/Metabolic panel Not depressed appearing Feet pain, normal exam, no sign of  neuropathy at this time, but will check glucose, no classic symptoms from his back      Relevant Orders   Testosterone   Hyperlipidemia   Relevant Medications   losartan (COZAAR) 50 MG tablet   Other Relevant Orders   Lipid panel   Routine general medical examination at a health care facility - Primary    CPE done, shingles vaccine sent to pharmacy PSA screening to be done       Relevant Orders   CBC with Differential/Platelet   Comprehensive metabolic panel   Lipid panel    Other Visit Diagnoses    Screening PSA (prostate specific antigen)       Relevant Orders   PSA   Pain in both lower extremities          Note: This dictation was prepared with Dragon dictation along with smaller phrase technology. Any transcriptional errors that result from this process are unintentional.

## 2018-12-17 NOTE — Patient Instructions (Addendum)
Shingles shot sent to pharmacy  We will call with lab results  [place] new patient paperwork for his Son F/U 6 months

## 2018-12-18 LAB — CBC WITH DIFFERENTIAL/PLATELET
Absolute Monocytes: 504 cells/uL (ref 200–950)
Basophils Absolute: 58 cells/uL (ref 0–200)
Basophils Relative: 1.1 %
EOS ABS: 133 {cells}/uL (ref 15–500)
EOS PCT: 2.5 %
HEMATOCRIT: 46.1 % (ref 38.5–50.0)
Hemoglobin: 16.1 g/dL (ref 13.2–17.1)
LYMPHS ABS: 917 {cells}/uL (ref 850–3900)
MCH: 31 pg (ref 27.0–33.0)
MCHC: 34.9 g/dL (ref 32.0–36.0)
MCV: 88.7 fL (ref 80.0–100.0)
MPV: 11.4 fL (ref 7.5–12.5)
Monocytes Relative: 9.5 %
NEUTROS PCT: 69.6 %
Neutro Abs: 3689 cells/uL (ref 1500–7800)
Platelets: 216 10*3/uL (ref 140–400)
RBC: 5.2 10*6/uL (ref 4.20–5.80)
RDW: 13.7 % (ref 11.0–15.0)
Total Lymphocyte: 17.3 %
WBC: 5.3 10*3/uL (ref 3.8–10.8)

## 2018-12-18 LAB — COMPREHENSIVE METABOLIC PANEL
AG Ratio: 1.7 (calc) (ref 1.0–2.5)
ALKALINE PHOSPHATASE (APISO): 78 U/L (ref 35–144)
ALT: 31 U/L (ref 9–46)
AST: 22 U/L (ref 10–35)
Albumin: 4.4 g/dL (ref 3.6–5.1)
BUN: 16 mg/dL (ref 7–25)
CHLORIDE: 104 mmol/L (ref 98–110)
CO2: 28 mmol/L (ref 20–32)
Calcium: 9.6 mg/dL (ref 8.6–10.3)
Creat: 1.06 mg/dL (ref 0.70–1.33)
Globulin: 2.6 g/dL (calc) (ref 1.9–3.7)
Glucose, Bld: 98 mg/dL (ref 65–99)
Potassium: 4.7 mmol/L (ref 3.5–5.3)
Sodium: 140 mmol/L (ref 135–146)
Total Bilirubin: 0.9 mg/dL (ref 0.2–1.2)
Total Protein: 7 g/dL (ref 6.1–8.1)

## 2018-12-18 LAB — LIPID PANEL
CHOL/HDL RATIO: 5.6 (calc) — AB (ref <5.0)
Cholesterol: 224 mg/dL — ABNORMAL HIGH (ref <200)
HDL: 40 mg/dL (ref >=40)
LDL CHOLESTEROL (CALC): 157 mg/dL — AB
NON-HDL CHOLESTEROL (CALC): 184 mg/dL — AB (ref <130)
Triglycerides: 143 mg/dL (ref <150)

## 2018-12-18 LAB — TESTOSTERONE: Testosterone: 480 ng/dL (ref 250–827)

## 2018-12-18 LAB — PSA: PSA: 1.5 ng/mL (ref <=4.0)

## 2019-06-20 ENCOUNTER — Other Ambulatory Visit: Payer: Self-pay

## 2019-06-23 ENCOUNTER — Ambulatory Visit: Payer: 59 | Admitting: Family Medicine

## 2019-06-23 ENCOUNTER — Other Ambulatory Visit: Payer: Self-pay

## 2019-06-23 ENCOUNTER — Encounter: Payer: Self-pay | Admitting: Family Medicine

## 2019-06-23 VITALS — BP 134/80 | HR 78 | Temp 98.6°F | Resp 14 | Ht 71.0 in | Wt 218.0 lb

## 2019-06-23 DIAGNOSIS — I10 Essential (primary) hypertension: Secondary | ICD-10-CM | POA: Diagnosis not present

## 2019-06-23 DIAGNOSIS — W57XXXA Bitten or stung by nonvenomous insect and other nonvenomous arthropods, initial encounter: Secondary | ICD-10-CM | POA: Diagnosis not present

## 2019-06-23 DIAGNOSIS — E7800 Pure hypercholesterolemia, unspecified: Secondary | ICD-10-CM | POA: Diagnosis not present

## 2019-06-23 MED ORDER — TRIAMCINOLONE ACETONIDE 0.1 % EX CREA: 1.0000 "application " | TOPICAL_CREAM | Freq: Two times a day (BID) | CUTANEOUS | 0 refills | Status: AC

## 2019-06-23 MED ORDER — PREDNISONE 20 MG PO TABS: 40.0000 mg | ORAL_TABLET | Freq: Every day | ORAL | 0 refills | Status: DC

## 2019-06-23 MED ORDER — LOSARTAN POTASSIUM 50 MG PO TABS: ORAL_TABLET | ORAL | 2 refills | Status: DC

## 2019-06-23 NOTE — Progress Notes (Signed)
   Subjective:    [name], male    DOB: Mar 14, 1961, 58 y.o.   MRN: [id number]  Patient presents for Follow-up (is fasting)   Pt here to f/u chronic medical problems     at CPE in Feb, had elevated cholesterol, LDL 157 HTN- taking BP meds as prescribed   Multiple mosquite bites after he went camping last weekend at the beach, on both legs and feet, few on larms using otc antihistamine  But still intense itching    Needs form for work signed for having CPE   Review Of Systems:  GEN- denies fatigue, fever, weight loss,weakness, recent illness HEENT- denies eye drainage, change in vision, nasal discharge, CVS- denies chest pain, palpitations RESP- denies SOB, cough, wheeze ABD- denies N/V, change in stools, abd pain GU- denies dysuria, hematuria, dribbling, incontinence MSK- denies joint pain, muscle aches, injury Neuro- denies headache, dizziness, syncope, seizure activity       Objective:    BP 134/80   Pulse 78   Temp 98.6 F (37 C) (Oral)   Resp 14   Ht 5\' 11"  (1.803 m)   Wt 218 lb (98.9 kg)   SpO2 97%   BMI 30.40 kg/m  GEN- NAD, alert and oriented x3 HEENT- PERRL, EOMI, non injected sclera, pink conjunctiva, MMM, oropharynx clear Neck- Supple, CVS- RRR, no murmur RESP-CTAB EXT- No edema Skin- multiple bug bites with erythema on bilat feet > 30 bites , 3-4 bites left forearm, no pustular lesions, or streaking Pulses- Radial, DP- 2+        Assessment & Plan:      Problem List Items Addressed This Visit      Unprioritized   Essential hypertension, benign - Primary    Well controlled , no changes to meds Check kidney function and cholesterol       Relevant Orders   Comprehensive metabolic panel   Hyperlipidemia    Working on dietary changes       Relevant Orders   Lipid panel    Other Visit Diagnoses    Mosquito bite, initial encounter       Multiple bites on feet, legs, given oral prednisone for itching and topical prn as he  will be going back to beach       Note: This dictation was prepared with Dragon dictation along with smaller phrase technology. Any transcriptional errors that result from this process are unintentional.

## 2019-06-23 NOTE — Patient Instructions (Signed)
F/U 6 months for Physical  

## 2019-06-23 NOTE — Assessment & Plan Note (Signed)
Well controlled , no changes to meds Check kidney function and cholesterol

## 2019-06-23 NOTE — Assessment & Plan Note (Signed)
Working on dietary changes  

## 2019-06-24 ENCOUNTER — Other Ambulatory Visit: Payer: Self-pay | Admitting: *Deleted

## 2019-06-24 DIAGNOSIS — E781 Pure hyperglyceridemia: Secondary | ICD-10-CM

## 2019-06-24 LAB — COMPREHENSIVE METABOLIC PANEL
AG Ratio: 1.8 (calc) (ref 1.0–2.5)
ALT: 31 U/L (ref 9–46)
AST: 27 U/L (ref 10–35)
Albumin: 4.4 g/dL (ref 3.6–5.1)
Alkaline phosphatase (APISO): 79 U/L (ref 35–144)
BUN: 12 mg/dL (ref 7–25)
CO2: 26 mmol/L (ref 20–32)
Calcium: 9.5 mg/dL (ref 8.6–10.3)
Chloride: 105 mmol/L (ref 98–110)
Creat: 1.11 mg/dL (ref 0.70–1.33)
Globulin: 2.5 g/dL (calc) (ref 1.9–3.7)
Glucose, Bld: 99 mg/dL (ref 65–99)
Potassium: 4.5 mmol/L (ref 3.5–5.3)
Sodium: 141 mmol/L (ref 135–146)
Total Bilirubin: 0.6 mg/dL (ref 0.2–1.2)
Total Protein: 6.9 g/dL (ref 6.1–8.1)

## 2019-06-24 LAB — LIPID PANEL
Cholesterol: 283 mg/dL — ABNORMAL HIGH (ref <200)
HDL: 28 mg/dL — ABNORMAL LOW (ref >=40)
Non-HDL Cholesterol (Calc): 255 mg/dL (calc) — ABNORMAL HIGH (ref <130)
Total CHOL/HDL Ratio: 10.1 (calc) — ABNORMAL HIGH (ref <5.0)
Triglycerides: 1394 mg/dL — ABNORMAL HIGH (ref <150)

## 2019-06-25 ENCOUNTER — Other Ambulatory Visit: Payer: 59

## 2019-06-25 ENCOUNTER — Other Ambulatory Visit: Payer: Self-pay

## 2019-06-25 DIAGNOSIS — E781 Pure hyperglyceridemia: Secondary | ICD-10-CM

## 2019-06-26 LAB — LIPID PANEL
Cholesterol: 247 mg/dL — ABNORMAL HIGH (ref <200)
HDL: 44 mg/dL (ref >=40)
LDL Cholesterol (Calc): 177 mg/dL (calc) — ABNORMAL HIGH
Non-HDL Cholesterol (Calc): 203 mg/dL (calc) — ABNORMAL HIGH (ref <130)
Total CHOL/HDL Ratio: 5.6 (calc) — ABNORMAL HIGH (ref <5.0)
Triglycerides: 129 mg/dL (ref <150)

## 2020-01-05 ENCOUNTER — Encounter: Payer: 59 | Admitting: Family Medicine

## 2020-03-08 ENCOUNTER — Ambulatory Visit: Payer: 59 | Admitting: Podiatry

## 2020-03-08 ENCOUNTER — Encounter: Payer: Self-pay | Admitting: Podiatry

## 2020-03-08 ENCOUNTER — Ambulatory Visit (INDEPENDENT_AMBULATORY_CARE_PROVIDER_SITE_OTHER): Payer: 59

## 2020-03-08 ENCOUNTER — Other Ambulatory Visit: Payer: Self-pay

## 2020-03-08 ENCOUNTER — Other Ambulatory Visit: Payer: Self-pay | Admitting: Podiatry

## 2020-03-08 VITALS — HR 65 | Temp 97.6°F | Resp 16

## 2020-03-08 DIAGNOSIS — M79672 Pain in left foot: Secondary | ICD-10-CM | POA: Diagnosis not present

## 2020-03-08 DIAGNOSIS — M722 Plantar fascial fibromatosis: Secondary | ICD-10-CM

## 2020-03-08 MED ORDER — DICLOFENAC SODIUM 75 MG PO TBEC: 75.0000 mg | DELAYED_RELEASE_TABLET | Freq: Two times a day (BID) | ORAL | 2 refills | Status: AC

## 2020-03-08 NOTE — Patient Instructions (Signed)

## 2020-03-08 NOTE — Progress Notes (Signed)
   Subjective:    [name], male    DOB: [birth year], 59 y.o.   MRN: [id number]  HPI    Review of Systems  All other systems reviewed and are negative.      Objective:   Physical Exam        Assessment & Plan:

## 2020-03-10 NOTE — Progress Notes (Signed)
Your motherSubjective:   [name], male   DOB: 59 y.o.   MRN: [id number]   HPI Patient presents with pain in the plantar aspect of the left heel states is been very sore and making walking difficult.  States is been dealing with this for several months and is worsened over that time and he is tried occasions ice and support.  Patient does not smoke likes to be active and states both feet are hurting but the left worse   Review of Systems  All other systems reviewed and are negative.       Objective:  Physical Exam Vitals and nursing note reviewed.  Constitutional:      Appearance: He is well-developed.  Pulmonary:     Effort: Pulmonary effort is normal.  Musculoskeletal:        General: Normal range of motion.  Skin:    General: Skin is warm.  Neurological:     Mental Status: He is alert.     Neurovascular status intact muscle strength was found to be adequate range of motion within normal limits.  Patient is found to have exquisite discomfort in the plantar aspect of the heel left over right with inflammation fluid in the medial band and moderate depression of the arch bilateral.  Patient is found to have good digital perfusion well oriented x3 with mild equinus condition bilateral     Assessment:  Acute plantar fasciitis left over right with inflammation fluid with moderate depression of the arch     Plan:  H&P conditions and x-rays reviewed and today I injected the plantar fascial left 3 mg Kenalog 5 mg liken after sterile prep and sterile dressing applied.  I applied fascial brace bilateral instructed on physical therapy shoe gear modifications and reappoint to recheck.  X-rays indicate there is a small spur no indications of stress fracture arthritis

## 2020-03-15 ENCOUNTER — Ambulatory Visit (INDEPENDENT_AMBULATORY_CARE_PROVIDER_SITE_OTHER): Payer: 59 | Admitting: Family Medicine

## 2020-03-15 ENCOUNTER — Other Ambulatory Visit: Payer: Self-pay

## 2020-03-15 ENCOUNTER — Encounter: Payer: Self-pay | Admitting: Family Medicine

## 2020-03-15 VITALS — BP 142/70 | HR 72 | Temp 98.5°F | Resp 14 | Ht 71.0 in | Wt 219.0 lb

## 2020-03-15 DIAGNOSIS — R3911 Hesitancy of micturition: Secondary | ICD-10-CM

## 2020-03-15 DIAGNOSIS — Z0000 Encounter for general adult medical examination without abnormal findings: Secondary | ICD-10-CM

## 2020-03-15 DIAGNOSIS — N401 Enlarged prostate with lower urinary tract symptoms: Secondary | ICD-10-CM

## 2020-03-15 DIAGNOSIS — E7800 Pure hypercholesterolemia, unspecified: Secondary | ICD-10-CM

## 2020-03-15 DIAGNOSIS — Z789 Other specified health status: Secondary | ICD-10-CM

## 2020-03-15 DIAGNOSIS — I10 Essential (primary) hypertension: Secondary | ICD-10-CM | POA: Diagnosis not present

## 2020-03-15 DIAGNOSIS — Z125 Encounter for screening for malignant neoplasm of prostate: Secondary | ICD-10-CM | POA: Diagnosis not present

## 2020-03-15 DIAGNOSIS — Z0001 Encounter for general adult medical examination with abnormal findings: Secondary | ICD-10-CM

## 2020-03-15 MED ORDER — TAMSULOSIN HCL 0.4 MG PO CAPS
0.4000 mg | ORAL_CAPSULE | Freq: Every day | ORAL | 6 refills | Status: DC
Start: 2020-03-15 — End: 2020-12-08

## 2020-03-15 NOTE — Patient Instructions (Addendum)
F/U 6 months  START flomax for prostate

## 2020-03-15 NOTE — Assessment & Plan Note (Signed)
Check PSA here we will also check urinalysis.  Plan to start Flomax 0.4 mg at dinnertime

## 2020-03-15 NOTE — Assessment & Plan Note (Signed)
Mildly elevated, took meds just before the visit.  Blood pressure has been controlled.  We will check his fasting labs today.

## 2020-03-15 NOTE — Assessment & Plan Note (Signed)
CPE done.  Immunizations declines COVID-19 vaccine

## 2020-03-15 NOTE — Progress Notes (Signed)
Subjective:    [name], male    DOB: 1961/01/18, 59 y.o.   MRN: [id number]  Patient presents for Annual Exam (is fasting)   Pt here for CPE, meds refilled    He has had problems with urine flow for months , he does get some right low back pain discomfort when he has difficulty urinating but this is not all the time.  He denies any pain in the center of his back or any radiating symptoms.  It is more of a pressure-like feeling when he does get it.  Resting the a.m. he often has a staying there for a while while the urine dribbles.  He is unable to get his stream out.  He does not have urinary frequency at that time.  States that his father went through the same thing with his prostate.  occ burning sensation, no gross hematuria       Plantar fascititis- followed by podiatry, he is using brace , icing, diclofenac    HTN- taking losartan 50g - HE MISSED BP med yesterday   Hyperlipidemia, last LDL 177, does not tolerate statins, he never tried the OTC   Colonoscopy UTD    Due for PSA screening     Follows with eye doctor and dentist          Review Of Systems:  GEN- denies fatigue, fever, weight loss,weakness, recent illness HEENT- denies eye drainage, change in vision, nasal discharge, CVS- denies chest pain, palpitations RESP- denies SOB, cough, wheeze ABD- denies N/V, change in stools, abd pain GU- denies dysuria, hematuria, dribbling, incontinence MSK- denies joint pain, muscle aches, injury Neuro- denies headache, dizziness, syncope, seizure activity       Objective:    BP (!) 142/70 (BP Location: Right Arm, Patient Position: Sitting, Cuff Size: Normal)   Pulse 72   Temp 98.5 F (36.9 C) (Temporal)   Resp 14   Ht 5\' 11"  (1.803 m)   Wt 219 lb (99.3 kg)   SpO2 96%   BMI 30.54 kg/m  GEN- NAD, alert and oriented x3 HEENT- PERRL, EOMI, non injected sclera, pink conjunctiva, MMM, oropharynx clear, TM clear no effusion, nares clear  Neck- Supple,  no thyromegaly CVS- RRR, no murmur RESP-CTAB ABD-NABS,soft,NT,ND, no CVA tenderness Musculoskeletal full range of motion of lumbar spine spine nontender strength intact in lower extremities EXT- No edema Pulses- Radial, DP- 2+   FALL/CAGE/Depression screen negative      Assessment & Plan:      Problem List Items Addressed This Visit      Unprioritized   BPH (benign prostatic hyperplasia)    Check PSA here we will also check urinalysis.  Plan to start Flomax 0.4 mg at dinnertime      Relevant Medications   tamsulosin (FLOMAX) 0.4 MG CAPS capsule   Other Relevant Orders   PSA   Urinalysis, Routine w reflex microscopic   Essential hypertension, benign    Mildly elevated, took meds just before the visit.  Blood pressure has been controlled.  We will check his fasting labs today.      Relevant Orders   CBC with Differential/Platelet   Comprehensive metabolic panel   Hyperlipidemia    tHe does not tolerate statins.  He did noTry red yeast rice yet.  We will check his labs and make further suggestions.      Routine general medical examination at a health care facility - Primary    CPE done.  Immunizations declines  COVID-19 vaccine      Relevant Orders   CBC with Differential/Platelet   Comprehensive metabolic panel   Lipid panel   Statin intolerance    Other Visit Diagnoses    Screening PSA (prostate specific antigen)       Relevant Orders   PSA      Note: This dictation was prepared with Dragon dictation along with smaller phrase technology. Any transcriptional errors that result from this process are unintentional.

## 2020-03-15 NOTE — Assessment & Plan Note (Signed)
tHe does not tolerate statins.  He did noTry red yeast rice yet.  We will check his labs and make further suggestions.

## 2020-03-16 LAB — COMPREHENSIVE METABOLIC PANEL
AG Ratio: 1.7 (calc) (ref 1.0–2.5)
ALT: 34 U/L (ref 9–46)
AST: 20 U/L (ref 10–35)
Albumin: 4.3 g/dL (ref 3.6–5.1)
Alkaline phosphatase (APISO): 89 U/L (ref 35–144)
BUN: 17 mg/dL (ref 7–25)
CO2: 27 mmol/L (ref 20–32)
Calcium: 9.6 mg/dL (ref 8.6–10.3)
Chloride: 102 mmol/L (ref 98–110)
Creat: 1.13 mg/dL (ref 0.70–1.33)
Globulin: 2.5 g/dL (calc) (ref 1.9–3.7)
Glucose, Bld: 88 mg/dL (ref 65–99)
Potassium: 4.3 mmol/L (ref 3.5–5.3)
Sodium: 140 mmol/L (ref 135–146)
Total Bilirubin: 1.7 mg/dL — ABNORMAL HIGH (ref 0.2–1.2)
Total Protein: 6.8 g/dL (ref 6.1–8.1)

## 2020-03-16 LAB — CBC WITH DIFFERENTIAL/PLATELET
Absolute Monocytes: 307 cells/uL (ref 200–950)
Basophils Absolute: 38 cells/uL (ref 0–200)
Basophils Relative: 0.9 %
Eosinophils Absolute: 50 cells/uL (ref 15–500)
Eosinophils Relative: 1.2 %
HCT: 48.6 % (ref 38.5–50.0)
Hemoglobin: 16.2 g/dL (ref 13.2–17.1)
Lymphs Abs: 626 cells/uL — ABNORMAL LOW (ref 850–3900)
MCH: 30.3 pg (ref 27.0–33.0)
MCHC: 33.3 g/dL (ref 32.0–36.0)
MCV: 90.8 fL (ref 80.0–100.0)
MPV: 11.3 fL (ref 7.5–12.5)
Monocytes Relative: 7.3 %
Neutro Abs: 3179 cells/uL (ref 1500–7800)
Neutrophils Relative %: 75.7 %
Platelets: 192 10*3/uL (ref 140–400)
RBC: 5.35 10*6/uL (ref 4.20–5.80)
RDW: 13.6 % (ref 11.0–15.0)
Total Lymphocyte: 14.9 %
WBC: 4.2 10*3/uL (ref 3.8–10.8)

## 2020-03-16 LAB — URINALYSIS, ROUTINE W REFLEX MICROSCOPIC
Bacteria, UA: NONE SEEN /HPF
Bilirubin Urine: NEGATIVE
Glucose, UA: NEGATIVE
Hgb urine dipstick: NEGATIVE
Hyaline Cast: NONE SEEN /LPF
Ketones, ur: NEGATIVE
Leukocytes,Ua: NEGATIVE
Nitrite: NEGATIVE
Protein, ur: NEGATIVE
Specific Gravity, Urine: 1.013 (ref 1.001–1.03)
WBC, UA: NONE SEEN /HPF (ref 0–5)
pH: 7 (ref 5.0–8.0)

## 2020-03-16 LAB — LIPID PANEL
Cholesterol: 281 mg/dL — ABNORMAL HIGH (ref <200)
HDL: 43 mg/dL (ref >=40)
LDL Cholesterol (Calc): 205 mg/dL (calc) — ABNORMAL HIGH
Non-HDL Cholesterol (Calc): 238 mg/dL (calc) — ABNORMAL HIGH (ref <130)
Total CHOL/HDL Ratio: 6.5 (calc) — ABNORMAL HIGH (ref <5.0)
Triglycerides: 165 mg/dL — ABNORMAL HIGH (ref <150)

## 2020-03-16 LAB — PSA: PSA: 1.4 ng/mL (ref <=4.0)

## 2020-04-05 ENCOUNTER — Other Ambulatory Visit: Payer: Self-pay

## 2020-04-05 ENCOUNTER — Encounter: Payer: Self-pay | Admitting: Podiatry

## 2020-04-05 ENCOUNTER — Ambulatory Visit: Payer: 59 | Admitting: Podiatry

## 2020-04-05 VITALS — Temp 97.2°F

## 2020-04-05 DIAGNOSIS — M722 Plantar fascial fibromatosis: Secondary | ICD-10-CM

## 2020-04-05 NOTE — Progress Notes (Signed)
Subjective:   [name], male   DOB: 59 y.o.   MRN: [id number]   HPI Patient presents stating he seems to be improved with the medication and the braces and states his pain has reduced quite a bit left over right.  States he still has discomfort at the end of the day but improved.  Patient also continues to take the oral anti-inflammatory and is just refilled it again   ROS      Objective:  Physical Exam  Acute plantar fasciitis left over right with inflammation much reduced quite a bit when I palpated into the area with pain still present but quite a bit improved     Assessment:  Fasciitis-like symptomatology bilateral which remains tender     Plan:  H&P reviewed condition and at this point I went ahead and discussed different treatment options including oral anti-inflammatories topical and gradually the reduction of the oral medication over the next few weeks.  Patient will be seen back to recheck again in the next several weeks and is encouraged to let us know if any issues were to occur and ultimately may require orthotics or other treatments depending on response

## 2020-04-10 ENCOUNTER — Other Ambulatory Visit: Payer: Self-pay | Admitting: Family Medicine

## 2020-05-10 ENCOUNTER — Telehealth: Payer: Self-pay | Admitting: *Deleted

## 2020-05-10 NOTE — Telephone Encounter (Signed)
Received call from patient.   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  Call placed to patient to inquire. [place].

## 2020-05-11 NOTE — Telephone Encounter (Signed)
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 States that he requires letter stating he is being treated by PCP for HTN and BP is usually at goal.   Letter written and last OV printed for patient.

## 2020-09-13 ENCOUNTER — Encounter: Payer: Self-pay | Admitting: Family Medicine

## 2020-09-13 ENCOUNTER — Ambulatory Visit: Payer: 59 | Admitting: Family Medicine

## 2020-09-13 ENCOUNTER — Other Ambulatory Visit: Payer: Self-pay

## 2020-09-13 VITALS — BP 140/78 | HR 78 | Temp 98.6°F | Resp 16 | Ht 71.0 in | Wt 215.0 lb

## 2020-09-13 DIAGNOSIS — E7800 Pure hypercholesterolemia, unspecified: Secondary | ICD-10-CM | POA: Diagnosis not present

## 2020-09-13 DIAGNOSIS — N401 Enlarged prostate with lower urinary tract symptoms: Secondary | ICD-10-CM | POA: Diagnosis not present

## 2020-09-13 DIAGNOSIS — Z789 Other specified health status: Secondary | ICD-10-CM | POA: Diagnosis not present

## 2020-09-13 DIAGNOSIS — I10 Essential (primary) hypertension: Secondary | ICD-10-CM

## 2020-09-13 DIAGNOSIS — R3911 Hesitancy of micturition: Secondary | ICD-10-CM

## 2020-09-13 NOTE — Assessment & Plan Note (Signed)
Systolic at upper limits  140, goal less than 501 Diastolic at goal He is reducing sugars, fast food, weight has gone down Continue to monitor bp goal < 140/90

## 2020-09-13 NOTE — Progress Notes (Signed)
   Subjective:    [name], male    DOB: 11/01/1960, 59 y.o.   MRN: [id number]  Patient presents for Follow-up (is fasting) Patient here follow-up chronic medical problems As reviewed.  He is due for fasting labs today  Hyperlipidemia he does not tolerate statins we discussed trying red yeast rice at her last visit, he is taking 1200mg  and omega 3    Hypertension he is taking his blood pressure medicine as prescribed, losartan 50mg  once a day ,states bp has been good   BPH he was stated for Flomax , this has helped a lot   Weight down 4lbs , he has been more active, he hasacut down to 1 soda a day, he has 2-3 cups of coffee, or water   He does have plantar fasciitis, he takes aleve 1-2 a week for this and joint pain in hands more stiffness until he gets going   No new concerns       BPH at the last visit Flomax was added review Of Systems:  GEN- denies fatigue, fever, weight loss,weakness, recent illness HEENT- denies eye drainage, change in vision, nasal discharge, CVS- denies chest pain, palpitations RESP- denies SOB, cough, wheeze ABD- denies N/V, change in stools, abd pain GU- denies dysuria, hematuria, dribbling, incontinence MSK- +s joint pain, muscle aches, injury Neuro- denies headache, dizziness, syncope, seizure activity       Objective:    BP 140/78   Pulse 78   Temp 98.6 F (37 C) (Temporal)   Resp 16   Ht 5\' 11"  (1.803 m)   Wt 215 lb (97.5 kg)   SpO2 98%   BMI 29.99 kg/m  GEN- NAD, alert and oriented x3 HEENT- PERRL, EOMI, non injected sclera, pink conjunctiva, MMM, oropharynx clear Neck- Supple  CVS- RRR, no murmur RESP-CTAB EXT- No edema Pulses- Radial 2+        Assessment & Plan:      Problem List Items Addressed This Visit      Unprioritized   BPH (benign prostatic hyperplasia)    Doing well on flomax       Essential hypertension, benign - Primary    Systolic at upper limits  140, goal less than 335 Diastolic  at goal He is reducing sugars, fast food, weight has gone down Continue to monitor bp goal < 140/90      Relevant Orders   Comprehensive metabolic panel   Hyperlipidemia    Red yeast rice and omega 3, plus dietary changes Recheck lipids       Relevant Orders   Lipid panel   Statin intolerance      Note: This dictation was prepared with Dragon dictation along with smaller phrase technology. Any transcriptional errors that result from this process are unintentional.

## 2020-09-13 NOTE — Patient Instructions (Signed)
F/U 6 months for Physical  

## 2020-09-13 NOTE — Assessment & Plan Note (Signed)
Red yeast rice and omega 3, plus dietary changes Recheck lipids

## 2020-09-13 NOTE — Assessment & Plan Note (Signed)
Doing well on flomax

## 2020-09-14 LAB — COMPREHENSIVE METABOLIC PANEL
AG Ratio: 1.8 (calc) (ref 1.0–2.5)
ALT: 24 U/L (ref 9–46)
AST: 16 U/L (ref 10–35)
Albumin: 4.3 g/dL (ref 3.6–5.1)
Alkaline phosphatase (APISO): 77 U/L (ref 35–144)
BUN: 12 mg/dL (ref 7–25)
CO2: 28 mmol/L (ref 20–32)
Calcium: 9.4 mg/dL (ref 8.6–10.3)
Chloride: 102 mmol/L (ref 98–110)
Creat: 1.09 mg/dL (ref 0.70–1.33)
Globulin: 2.4 g/dL (calc) (ref 1.9–3.7)
Glucose, Bld: 101 mg/dL — ABNORMAL HIGH (ref 65–99)
Potassium: 3.8 mmol/L (ref 3.5–5.3)
Sodium: 139 mmol/L (ref 135–146)
Total Bilirubin: 1.6 mg/dL — ABNORMAL HIGH (ref 0.2–1.2)
Total Protein: 6.7 g/dL (ref 6.1–8.1)

## 2020-09-14 LAB — LIPID PANEL
Cholesterol: 234 mg/dL — ABNORMAL HIGH (ref <200)
HDL: 42 mg/dL (ref >=40)
LDL Cholesterol (Calc): 160 mg/dL (calc) — ABNORMAL HIGH
Non-HDL Cholesterol (Calc): 192 mg/dL (calc) — ABNORMAL HIGH (ref <130)
Total CHOL/HDL Ratio: 5.6 (calc) — ABNORMAL HIGH (ref <5.0)
Triglycerides: 170 mg/dL — ABNORMAL HIGH (ref <150)

## 2020-09-15 ENCOUNTER — Encounter: Payer: Self-pay | Admitting: *Deleted

## 2020-10-17 ENCOUNTER — Other Ambulatory Visit: Payer: Self-pay | Admitting: Family Medicine

## 2020-12-08 ENCOUNTER — Other Ambulatory Visit: Payer: Self-pay | Admitting: Family Medicine

## 2021-01-10 ENCOUNTER — Telehealth: Payer: Self-pay | Admitting: Family Medicine

## 2021-01-10 MED ORDER — LOSARTAN POTASSIUM 50 MG PO TABS: 50.0000 mg | ORAL_TABLET | Freq: Every day | ORAL | 3 refills | Status: AC

## 2021-01-10 MED ORDER — TAMSULOSIN HCL 0.4 MG PO CAPS: ORAL_CAPSULE | ORAL | 3 refills | Status: AC

## 2021-01-10 NOTE — Telephone Encounter (Signed)
Pt called needing to get a refill of  losartan (COZAAR) 50 MG tablet  tamsulosin (FLOMAX) 0.4 MG CAPS capsule   Cb#: [phone]

## 2021-03-16 ENCOUNTER — Encounter: Payer: 59 | Admitting: Family Medicine

## 2021-03-21 ENCOUNTER — Encounter: Payer: 59 | Admitting: Family Medicine

## 2021-05-06 ENCOUNTER — Other Ambulatory Visit: Payer: Self-pay | Admitting: Family Medicine

## 2021-06-05 ENCOUNTER — Other Ambulatory Visit: Payer: Self-pay | Admitting: Family Medicine

## 2022-02-01 ENCOUNTER — Encounter: Payer: Self-pay | Admitting: Gastroenterology

## 2024-12-17 ENCOUNTER — Ambulatory Visit: Admitting: Family Medicine
